# Patient Record
Sex: Female | Born: 1980 | Race: Black or African American | Hispanic: No | Marital: Married | Smoking: Never smoker
Health system: Southern US, Community
[De-identification: ages and names within clinical notes are randomized; demographics above are authoritative.]

## PROBLEM LIST (undated history)

## (undated) ENCOUNTER — Emergency Department (HOSPITAL_BASED_OUTPATIENT_CLINIC_OR_DEPARTMENT_OTHER): Source: Home / Self Care

## (undated) HISTORY — PX: MYOMECTOMY: SHX85

---

## 2015-04-23 DIAGNOSIS — N921 Excessive and frequent menstruation with irregular cycle: Secondary | ICD-10-CM | POA: Diagnosis present

## 2015-04-23 NOTE — H&P (Signed)
Amanda Baird is an 35 y.o. female. G0 with menorrhagia presenting for scheduled surgery. Pt has a history of fibroids causing heavy menses and pelvic pressure. She had a myomectomy in 2013 and had improvement of symptoms however in past year has has heavier menses and pelvic discomfort is similar to prior to myomectomy. Pelvic ultrasound showed several fibroids ranging from 7-29mm with nl endometrial lining. Reviewed all options for managememt including hormonal pills/implants/LARCS, lysteda, myomectomy, mrifus, uterine artery embolization, hysterectomy R/b discussed and all questions answered. Pt opts for total laparoscopic hysterectomy, possible lavh or open, bilateral salpingectomy, possible cystoscopy. Does not plan on carrying a pregnancy in future  Pertinent Gynecological History: Menses: flow is excessive with use of >10 pads or tampons on heaviest days Bleeding: dysfunctional uterine bleeding Contraception: none DES exposure: denies Blood transfusions: none Sexually transmitted diseases: no past history Previous GYN Procedures: myomectomy  Last mammogram: n/a Date:  Last pap: normal Date: 03/24/2015 OB History: G0, P0   Menstrual History: Menarche age: 33  No LMP recorded.    No past medical history on file.  No past surgical history on file.  No family history on file.  Social History:  has no tobacco, alcohol, and drug history on file.  Allergies: Allergies not on file  No prescriptions prior to admission    Review of Systems  Constitutional: Positive for malaise/fatigue. Negative for fever, chills and weight loss.  Eyes: Negative for blurred vision.  Respiratory: Negative for shortness of breath.   Cardiovascular: Negative for chest pain and orthopnea.  Gastrointestinal: Positive for abdominal pain. Negative for heartburn, nausea and vomiting.  Genitourinary: Negative for dysuria.  Musculoskeletal: Negative for back pain.  Neurological: Negative for dizziness,  focal weakness and headaches.  Psychiatric/Behavioral: Negative for depression. The patient is not nervous/anxious.     There were no vitals taken for this visit. Physical Exam  Constitutional: She is oriented to person, place, and time. She appears well-developed and well-nourished.  HENT:  Head: Normocephalic.  Neck: Normal range of motion.  Cardiovascular: Normal rate.   Respiratory: Effort normal.  GI: Soft. She exhibits no mass. There is no rebound and no guarding.  Genitourinary: Vagina normal and uterus normal.  Musculoskeletal: Normal range of motion.  Neurological: She is alert and oriented to person, place, and time.  Skin: Skin is warm.  Psychiatric: She has a normal mood and affect. Her behavior is normal. Judgment and thought content normal.    No results found for this or any previous visit (from the past 24 hour(s)).  No results found.  Assessment/Plan: 35yo G0 female with menorrhagia due to fibroids opting for definitive treatment with total laparoscopic hysterectomy, possible lavh or open, bilateral salpingectomy, possible cystoscopy R/B/A reviewed and all questions answered To OR when ready  Sherlyn Hay 04/23/2015, 11:53 PM

## 2015-05-17 ENCOUNTER — Encounter (HOSPITAL_COMMUNITY)
Admission: RE | Admit: 2015-05-17 | Discharge: 2015-05-17 | Disposition: A | Discharge status: Home / Self Care | Payer: BLUE CROSS/BLUE SHIELD | Source: Ambulatory Visit | Attending: Obstetrics and Gynecology | Admitting: Obstetrics and Gynecology

## 2015-05-17 ENCOUNTER — Encounter (HOSPITAL_COMMUNITY): Payer: Self-pay

## 2015-05-17 DIAGNOSIS — Z01812 Encounter for preprocedural laboratory examination: Secondary | ICD-10-CM | POA: Diagnosis not present

## 2015-05-17 DIAGNOSIS — N921 Excessive and frequent menstruation with irregular cycle: Secondary | ICD-10-CM | POA: Diagnosis not present

## 2015-05-17 LAB — CBC WITH DIFFERENTIAL/PLATELET
Basophils Absolute: 0 10*3/uL (ref 0.0–0.1)
Basophils Relative: 0 %
Eosinophils Absolute: 0.1 10*3/uL (ref 0.0–0.7)
Eosinophils Relative: 2 %
HEMATOCRIT: 41.5 % (ref 36.0–46.0)
HEMOGLOBIN: 14 g/dL (ref 12.0–15.0)
LYMPHS ABS: 1.4 10*3/uL (ref 0.7–4.0)
LYMPHS PCT: 28 %
MCH: 31.3 pg (ref 26.0–34.0)
MCHC: 33.7 g/dL (ref 30.0–36.0)
MCV: 92.8 fL (ref 78.0–100.0)
MONOS PCT: 8 %
Monocytes Absolute: 0.4 10*3/uL (ref 0.1–1.0)
NEUTROS ABS: 3 10*3/uL (ref 1.7–7.7)
NEUTROS PCT: 62 %
Platelets: 201 10*3/uL (ref 150–400)
RBC: 4.47 MIL/uL (ref 3.87–5.11)
RDW: 12.5 % (ref 11.5–15.5)
WBC: 4.9 10*3/uL (ref 4.0–10.5)

## 2015-05-17 LAB — BASIC METABOLIC PANEL
ANION GAP: 6 (ref 5–15)
BUN: 15 mg/dL (ref 6–20)
CHLORIDE: 103 mmol/L (ref 101–111)
CO2: 27 mmol/L (ref 22–32)
Calcium: 9.1 mg/dL (ref 8.9–10.3)
Creatinine, Ser: 0.81 mg/dL (ref 0.44–1.00)
GFR calc non Af Amer: >60 mL/min (ref >60)
Glucose, Bld: 94 mg/dL (ref 65–99)
Potassium: 4.4 mmol/L (ref 3.5–5.1)
Sodium: 136 mmol/L (ref 135–145)

## 2015-05-17 LAB — TYPE AND SCREEN
ABO/RH(D): O POS
Antibody Screen: NEGATIVE

## 2015-05-17 LAB — ABO/RH: ABO/RH(D): O POS

## 2015-05-17 NOTE — Patient Instructions (Addendum)
Your procedure is scheduled on:  Thursday, May 26, 2015  Enter through the Main Entrance of [facility] at: 6:00 AM  Pick up the phone at the desk and dial [phone].  Call this number if you have problems the morning of surgery: [phone].  Remember:  Do NOT eat food or drink after:  Midnight Wednesday May 25, 2015  Take these medicines the morning of surgery with a SIP OF WATER:  None  Do NOT wear jewelry (body piercing), metal hair clips/bobby pins, make-up, or nail polish. Do NOT wear lotions, powders, or perfumes.  You may wear deoderant. Do NOT shave for 48 hours prior to surgery. Do NOT bring valuables to the hospital. Contacts, dentures, or bridgework may not be worn into surgery.  Leave suitcase in car.  After surgery it may be brought to your room.  For patients admitted to the hospital, checkout time is 11:00 AM the day of discharge.

## 2015-05-26 ENCOUNTER — Ambulatory Visit (HOSPITAL_COMMUNITY): Payer: BLUE CROSS/BLUE SHIELD | Admitting: Anesthesiology

## 2015-05-26 ENCOUNTER — Encounter (HOSPITAL_COMMUNITY): Payer: Self-pay | Admitting: Anesthesiology

## 2015-05-26 ENCOUNTER — Inpatient Hospital Stay (HOSPITAL_COMMUNITY)
Admission: AD | Admit: 2015-05-26 | Discharge: 2015-05-27 | DRG: 743 | Disposition: A | Discharge status: Home / Self Care | Payer: BLUE CROSS/BLUE SHIELD | Source: Ambulatory Visit | Attending: Obstetrics and Gynecology | Admitting: Obstetrics and Gynecology

## 2015-05-26 ENCOUNTER — Encounter (HOSPITAL_COMMUNITY)
Admission: AD | Disposition: A | Discharge status: Home / Self Care | Payer: Self-pay | Source: Ambulatory Visit | Attending: Obstetrics and Gynecology

## 2015-05-26 DIAGNOSIS — D252 Subserosal leiomyoma of uterus: Secondary | ICD-10-CM | POA: Diagnosis present

## 2015-05-26 DIAGNOSIS — D251 Intramural leiomyoma of uterus: Secondary | ICD-10-CM | POA: Diagnosis present

## 2015-05-26 DIAGNOSIS — N921 Excessive and frequent menstruation with irregular cycle: Principal | ICD-10-CM | POA: Diagnosis present

## 2015-05-26 DIAGNOSIS — Z90710 Acquired absence of both cervix and uterus: Secondary | ICD-10-CM | POA: Diagnosis present

## 2015-05-26 HISTORY — PX: ABDOMINAL HYSTERECTOMY: SHX81

## 2015-05-26 HISTORY — PX: LAPAROSCOPY: SHX197

## 2015-05-26 LAB — PREGNANCY, URINE: Preg Test, Ur: NEGATIVE

## 2015-05-26 SURGERY — HYSTERECTOMY, ABDOMINAL
Anesthesia: General

## 2015-05-26 MED ORDER — LACTATED RINGERS IV SOLN
INTRAVENOUS | Status: DC
  Administered 2015-05-26: 13:00:00 via INTRAVENOUS

## 2015-05-26 MED ORDER — SUGAMMADEX SODIUM 200 MG/2ML IV SOLN
INTRAVENOUS | Status: DC | PRN
  Administered 2015-05-26: 123.6 mg via INTRAVENOUS

## 2015-05-26 MED ORDER — HYDROMORPHONE HCL 1 MG/ML IJ SOLN
INTRAMUSCULAR | Status: AC
  Filled 2015-05-26: qty 1

## 2015-05-26 MED ORDER — ONDANSETRON HCL 4 MG/2ML IJ SOLN: 4.0000 mg | Freq: Four times a day (QID) | INTRAMUSCULAR | Status: DC | PRN

## 2015-05-26 MED ORDER — BUPIVACAINE HCL (PF) 0.25 % IJ SOLN
INTRAMUSCULAR | Status: AC
  Filled 2015-05-26: qty 30

## 2015-05-26 MED ORDER — LACTATED RINGERS IR SOLN
Status: DC | PRN
  Administered 2015-05-26: 3000 mL

## 2015-05-26 MED ORDER — ONDANSETRON HCL 4 MG/2ML IJ SOLN
INTRAMUSCULAR | Status: AC
  Filled 2015-05-26: qty 2

## 2015-05-26 MED ORDER — MIDAZOLAM HCL 2 MG/2ML IJ SOLN
INTRAMUSCULAR | Status: DC | PRN
  Administered 2015-05-26 (×2): 1 mg via INTRAVENOUS

## 2015-05-26 MED ORDER — PROPOFOL 10 MG/ML IV BOLUS
INTRAVENOUS | Status: AC
  Filled 2015-05-26: qty 20

## 2015-05-26 MED ORDER — LACTATED RINGERS IV SOLN
INTRAVENOUS | Status: DC
Start: 2015-05-26 — End: 2015-05-26
  Administered 2015-05-26: 06:00:00 via INTRAVENOUS

## 2015-05-26 MED ORDER — ROCURONIUM BROMIDE 100 MG/10ML IV SOLN
INTRAVENOUS | Status: DC | PRN
  Administered 2015-05-26: 10 mg via INTRAVENOUS
  Administered 2015-05-26: 40 mg via INTRAVENOUS

## 2015-05-26 MED ORDER — LACTATED RINGERS IV SOLN
INTRAVENOUS | Status: DC
  Administered 2015-05-26 (×2): via INTRAVENOUS

## 2015-05-26 MED ORDER — ONDANSETRON HCL 4 MG/2ML IJ SOLN
INTRAMUSCULAR | Status: DC | PRN
  Administered 2015-05-26: 4 mg via INTRAVENOUS

## 2015-05-26 MED ORDER — IBUPROFEN 600 MG PO TABS
600.0000 mg | ORAL_TABLET | Freq: Four times a day (QID) | ORAL | Status: DC | PRN
  Administered 2015-05-27 (×3): 600 mg via ORAL
  Filled 2015-05-26 (×3): qty 1

## 2015-05-26 MED ORDER — FENTANYL CITRATE (PF) 250 MCG/5ML IJ SOLN
INTRAMUSCULAR | Status: AC
  Filled 2015-05-26: qty 5

## 2015-05-26 MED ORDER — MIDAZOLAM HCL 2 MG/2ML IJ SOLN
INTRAMUSCULAR | Status: AC
  Filled 2015-05-26: qty 2

## 2015-05-26 MED ORDER — BUPIVACAINE HCL (PF) 0.25 % IJ SOLN
INTRAMUSCULAR | Status: DC | PRN
  Administered 2015-05-26: 3 mL

## 2015-05-26 MED ORDER — CEFAZOLIN SODIUM-DEXTROSE 2-3 GM-% IV SOLR
INTRAVENOUS | Status: AC
  Filled 2015-05-26: qty 50

## 2015-05-26 MED ORDER — HYDROMORPHONE HCL 1 MG/ML IJ SOLN
0.2000 mg | INTRAMUSCULAR | Status: DC | PRN
  Administered 2015-05-26: 0.2 mg via INTRAVENOUS
  Filled 2015-05-26: qty 1

## 2015-05-26 MED ORDER — PROMETHAZINE HCL 25 MG/ML IJ SOLN: 6.2500 mg | INTRAMUSCULAR | Status: DC | PRN

## 2015-05-26 MED ORDER — SUGAMMADEX SODIUM 200 MG/2ML IV SOLN
INTRAVENOUS | Status: AC
  Filled 2015-05-26: qty 2

## 2015-05-26 MED ORDER — ESMOLOL HCL 100 MG/10ML IV SOLN
INTRAVENOUS | Status: AC
Start: 2015-05-26 — End: 2015-05-26
  Filled 2015-05-26: qty 10

## 2015-05-26 MED ORDER — ONDANSETRON HCL 4 MG PO TABS: 4.0000 mg | ORAL_TABLET | Freq: Four times a day (QID) | ORAL | Status: DC | PRN

## 2015-05-26 MED ORDER — KETOROLAC TROMETHAMINE 30 MG/ML IJ SOLN
INTRAMUSCULAR | Status: AC
  Filled 2015-05-26: qty 1

## 2015-05-26 MED ORDER — SIMETHICONE 80 MG PO CHEW
80.0000 mg | CHEWABLE_TABLET | Freq: Four times a day (QID) | ORAL | Status: DC | PRN
  Filled 2015-05-26: qty 1

## 2015-05-26 MED ORDER — SCOPOLAMINE 1 MG/3DAYS TD PT72
1.0000 | MEDICATED_PATCH | Freq: Once | TRANSDERMAL | Status: DC
  Administered 2015-05-26: 1.5 mg via TRANSDERMAL

## 2015-05-26 MED ORDER — MAGNESIUM HYDROXIDE 400 MG/5ML PO SUSP
30.0000 mL | Freq: Every day | ORAL | Status: DC | PRN
  Filled 2015-05-26: qty 30

## 2015-05-26 MED ORDER — LIDOCAINE HCL (CARDIAC) 20 MG/ML IV SOLN
INTRAVENOUS | Status: AC
  Filled 2015-05-26: qty 5

## 2015-05-26 MED ORDER — ESMOLOL HCL 100 MG/10ML IV SOLN
INTRAVENOUS | Status: DC | PRN
  Administered 2015-05-26: 5 mg via INTRAVENOUS

## 2015-05-26 MED ORDER — CEFAZOLIN SODIUM-DEXTROSE 2-4 GM/100ML-% IV SOLN
2.0000 g | INTRAVENOUS | Status: AC
  Administered 2015-05-26: 2 g via INTRAVENOUS
  Filled 2015-05-26: qty 100

## 2015-05-26 MED ORDER — LIDOCAINE HCL 1 % IJ SOLN
INTRAMUSCULAR | Status: AC
  Filled 2015-05-26: qty 20

## 2015-05-26 MED ORDER — PHENYLEPHRINE 40 MCG/ML (10ML) SYRINGE FOR IV PUSH (FOR BLOOD PRESSURE SUPPORT)
PREFILLED_SYRINGE | INTRAVENOUS | Status: AC
  Filled 2015-05-26: qty 10

## 2015-05-26 MED ORDER — PHENYLEPHRINE HCL 10 MG/ML IJ SOLN
INTRAMUSCULAR | Status: DC | PRN
  Administered 2015-05-26: 40 ug via INTRAVENOUS
  Administered 2015-05-26 (×2): 80 ug via INTRAVENOUS

## 2015-05-26 MED ORDER — HYDROMORPHONE HCL 1 MG/ML IJ SOLN
0.2500 mg | INTRAMUSCULAR | Status: DC | PRN
  Administered 2015-05-26: 0.5 mg via INTRAVENOUS
  Administered 2015-05-26: 0.25 mg via INTRAVENOUS
  Administered 2015-05-26: 0.5 mg via INTRAVENOUS
  Administered 2015-05-26: 0.25 mg via INTRAVENOUS

## 2015-05-26 MED ORDER — ROCURONIUM BROMIDE 100 MG/10ML IV SOLN
INTRAVENOUS | Status: AC
  Filled 2015-05-26: qty 1

## 2015-05-26 MED ORDER — GLYCOPYRROLATE 0.2 MG/ML IJ SOLN
INTRAMUSCULAR | Status: AC
  Filled 2015-05-26: qty 3

## 2015-05-26 MED ORDER — DEXAMETHASONE SODIUM PHOSPHATE 4 MG/ML IJ SOLN
INTRAMUSCULAR | Status: AC
  Filled 2015-05-26: qty 1

## 2015-05-26 MED ORDER — NEOSTIGMINE METHYLSULFATE 10 MG/10ML IV SOLN
INTRAVENOUS | Status: AC
  Filled 2015-05-26: qty 1

## 2015-05-26 MED ORDER — MEPERIDINE HCL 25 MG/ML IJ SOLN: 6.2500 mg | INTRAMUSCULAR | Status: DC | PRN

## 2015-05-26 MED ORDER — KETOROLAC TROMETHAMINE 30 MG/ML IJ SOLN: 30.0000 mg | Freq: Once | INTRAMUSCULAR | Status: DC

## 2015-05-26 MED ORDER — DEXAMETHASONE SODIUM PHOSPHATE 10 MG/ML IJ SOLN
INTRAMUSCULAR | Status: DC | PRN
  Administered 2015-05-26: 4 mg via INTRAVENOUS

## 2015-05-26 MED ORDER — MENTHOL 3 MG MT LOZG
1.0000 | LOZENGE | OROMUCOSAL | Status: DC | PRN
Start: 2015-05-26 — End: 2015-05-27
  Filled 2015-05-26: qty 9

## 2015-05-26 MED ORDER — SCOPOLAMINE 1 MG/3DAYS TD PT72
MEDICATED_PATCH | TRANSDERMAL | Status: AC
  Filled 2015-05-26: qty 1

## 2015-05-26 MED ORDER — KETOROLAC TROMETHAMINE 30 MG/ML IJ SOLN
INTRAMUSCULAR | Status: DC | PRN
  Administered 2015-05-26: 30 mg via INTRAVENOUS

## 2015-05-26 MED ORDER — SODIUM CHLORIDE 0.9 % IJ SOLN
INTRAMUSCULAR | Status: AC
  Filled 2015-05-26: qty 10

## 2015-05-26 MED ORDER — PROPOFOL 10 MG/ML IV BOLUS
INTRAVENOUS | Status: DC | PRN
  Administered 2015-05-26: 170 mg via INTRAVENOUS

## 2015-05-26 MED ORDER — FENTANYL CITRATE (PF) 100 MCG/2ML IJ SOLN
INTRAMUSCULAR | Status: DC | PRN
  Administered 2015-05-26: 50 ug via INTRAVENOUS
  Administered 2015-05-26: 100 ug via INTRAVENOUS
  Administered 2015-05-26 (×2): 50 ug via INTRAVENOUS

## 2015-05-26 MED ORDER — OXYCODONE-ACETAMINOPHEN 5-325 MG PO TABS: 2.0000 | ORAL_TABLET | Freq: Four times a day (QID) | ORAL | Status: DC | PRN

## 2015-05-26 MED ORDER — LIDOCAINE HCL (CARDIAC) 20 MG/ML IV SOLN
INTRAVENOUS | Status: DC | PRN
  Administered 2015-05-26: 30 mg via INTRAVENOUS
  Administered 2015-05-26: 70 mg via INTRAVENOUS

## 2015-05-26 SURGICAL SUPPLY — 67 items
BARRIER ADHS 3X4 INTERCEED (GAUZE/BANDAGES/DRESSINGS) IMPLANT
BENZOIN TINCTURE PRP APPL 2/3 (GAUZE/BANDAGES/DRESSINGS) ×4 IMPLANT
CABLE HIGH FREQUENCY MONO STRZ (ELECTRODE) IMPLANT
CANISTER SUCT 3000ML (MISCELLANEOUS) ×4 IMPLANT
CHLORAPREP W/TINT 26ML (MISCELLANEOUS) IMPLANT
CLOSURE WOUND 1/2 X4 (GAUZE/BANDAGES/DRESSINGS) ×1
CLOTH BEACON ORANGE TIMEOUT ST (SAFETY) ×4 IMPLANT
COVER BACK TABLE 60X90IN (DRAPES) ×4 IMPLANT
COVER LIGHT HANDLE  1/PK (MISCELLANEOUS) ×4
COVER LIGHT HANDLE 1/PK (MISCELLANEOUS) ×4 IMPLANT
COVER MAYO STAND STRL (DRAPES) ×4 IMPLANT
DECANTER SPIKE VIAL GLASS SM (MISCELLANEOUS) ×4 IMPLANT
DRAPE WARM FLUID 44X44 (DRAPE) IMPLANT
DRSG OPSITE POSTOP 4X10 (GAUZE/BANDAGES/DRESSINGS) ×4 IMPLANT
DRSG TELFA 3X8 NADH (GAUZE/BANDAGES/DRESSINGS) ×4 IMPLANT
DURAPREP 26ML APPLICATOR (WOUND CARE) ×4 IMPLANT
EVACUATOR SMOKE 8.L (FILTER) ×4 IMPLANT
GAUZE SPONGE 4X4 16PLY XRAY LF (GAUZE/BANDAGES/DRESSINGS) IMPLANT
GLOVE BIO SURGEON STRL SZ 6.5 (GLOVE) ×6 IMPLANT
GLOVE BIO SURGEONS STRL SZ 6.5 (GLOVE) ×2
GLOVE BIOGEL PI IND STRL 7.0 (GLOVE) ×8 IMPLANT
GLOVE BIOGEL PI INDICATOR 7.0 (GLOVE) ×8
GLOVE ORTHO TXT STRL SZ7.5 (GLOVE) ×4 IMPLANT
GOWN STRL REUS W/TWL LRG LVL3 (GOWN DISPOSABLE) ×12 IMPLANT
LIQUID BAND (GAUZE/BANDAGES/DRESSINGS) ×4 IMPLANT
NEEDLE HYPO 22GX1.5 SAFETY (NEEDLE) IMPLANT
NS IRRIG 1000ML POUR BTL (IV SOLUTION) ×4 IMPLANT
OCCLUDER COLPOPNEUMO (BALLOONS) ×4 IMPLANT
PACK ABDOMINAL GYN (CUSTOM PROCEDURE TRAY) ×4 IMPLANT
PACK LAVH (CUSTOM PROCEDURE TRAY) ×4 IMPLANT
PACK ROBOTIC GOWN (GOWN DISPOSABLE) ×8 IMPLANT
PAD OB MATERNITY 4.3X12.25 (PERSONAL CARE ITEMS) ×4 IMPLANT
PAD TRENDELENBURG POSITION (MISCELLANEOUS) ×4 IMPLANT
PENCIL SMOKE EVAC W/HOLSTER (ELECTROSURGICAL) ×4 IMPLANT
RTRCTR C-SECT PINK 25CM LRG (MISCELLANEOUS) IMPLANT
SCALPEL HARMONIC ACE (MISCELLANEOUS) ×4 IMPLANT
SCISSORS LAP 5X35 DISP (ENDOMECHANICALS) IMPLANT
SET CYSTO W/LG BORE CLAMP LF (SET/KITS/TRAYS/PACK) IMPLANT
SET IRRIG TUBING LAPAROSCOPIC (IRRIGATION / IRRIGATOR) ×4 IMPLANT
SLEEVE XCEL OPT CAN 5 100 (ENDOMECHANICALS) ×4 IMPLANT
SPATULA 33CM PLASMA (CUTTING FORCEPS) ×4 IMPLANT
SPONGE LAP 18X18 X RAY DECT (DISPOSABLE) ×8 IMPLANT
STAPLER VISISTAT 35W (STAPLE) ×4 IMPLANT
STRIP CLOSURE SKIN 1/2X4 (GAUZE/BANDAGES/DRESSINGS) ×3 IMPLANT
SUT DVC VLOC 180 0 12IN GS21 (SUTURE) ×4
SUT PDS AB 0 CTX 60 (SUTURE) IMPLANT
SUT VIC AB 0 CT1 27 (SUTURE) ×4
SUT VIC AB 0 CT1 27XBRD ANBCTR (SUTURE) ×4 IMPLANT
SUT VIC AB 0 CT1 36 (SUTURE) IMPLANT
SUT VIC AB 4-0 KS 27 (SUTURE) ×4 IMPLANT
SUT VIC AB 4-0 PS2 27 (SUTURE) ×4 IMPLANT
SUT VICRYL 0 TIES 12 18 (SUTURE) IMPLANT
SUT VICRYL 0 UR6 27IN ABS (SUTURE) ×4 IMPLANT
SUTURE DVC VLC 180 0 12IN GS21 (SUTURE) ×2 IMPLANT
SYR CONTROL 10ML LL (SYRINGE) IMPLANT
SYR TOOMEY 50ML (SYRINGE) ×4 IMPLANT
SYRINGE 10CC LL (SYRINGE) ×4 IMPLANT
TIP UTERINE 5.1X6CM LAV DISP (MISCELLANEOUS) IMPLANT
TIP UTERINE 6.7X10CM GRN DISP (MISCELLANEOUS) IMPLANT
TIP UTERINE 6.7X6CM WHT DISP (MISCELLANEOUS) IMPLANT
TIP UTERINE 6.7X8CM BLUE DISP (MISCELLANEOUS) ×4 IMPLANT
TOWEL OR 17X24 6PK STRL BLUE (TOWEL DISPOSABLE) ×8 IMPLANT
TRAY FOLEY CATH SILVER 14FR (SET/KITS/TRAYS/PACK) ×4 IMPLANT
TROCAR OPTI TIP 5M 100M (ENDOMECHANICALS) ×4 IMPLANT
TROCAR XCEL DIL TIP R 11M (ENDOMECHANICALS) ×4 IMPLANT
WARMER LAPAROSCOPE (MISCELLANEOUS) ×4 IMPLANT
WATER STERILE IRR 1000ML POUR (IV SOLUTION) ×4 IMPLANT

## 2015-05-26 NOTE — Anesthesia Postprocedure Evaluation (Signed)
Anesthesia Post Note  Patient: Amanda Baird  Procedure(s) Performed: Procedure(s) (LRB):  ABDOMINAL HYSTERECTOMY with bilateral salpingectomy. (N/A) LAPAROSCOPY DIAGNOSTIC (N/A)  Patient location during evaluation: PACU Anesthesia Type: General Level of consciousness: awake Pain management: pain level controlled Vital Signs Assessment: post-procedure vital signs reviewed and stable Respiratory status: spontaneous breathing Cardiovascular status: stable Postop Assessment: no signs of nausea or vomiting Anesthetic complications: no    Last Vitals:  Filed Vitals:   05/26/15 0945 05/26/15 1000  BP: 115/68 111/66  Pulse: 85 80  Temp:    Resp: 16 16    Last Pain:  Filed Vitals:   05/26/15 1006  PainSc: [place]

## 2015-05-26 NOTE — Addendum Note (Signed)
Addendum  created 05/26/15 1356 by Riki Sheer, CRNA   Modules edited: Clinical Notes   Clinical Notes:  File: [id number]

## 2015-05-26 NOTE — Brief Op Note (Signed)
05/26/2015  9:26 AM  PATIENT:  Amanda Baird  35 y.o. female  PRE-OPERATIVE DIAGNOSIS:  Menometrorrhagia, fibroids  POST-OPERATIVE DIAGNOSIS:  Menometrorrhagia, fibroids  PROCEDURE:  Procedure(s):  ABDOMINAL HYSTERECTOMY with bilateral salpingectomy. (N/A) LAPAROSCOPY DIAGNOSTIC (N/A)  SURGEON:  Surgeon(s) and Role:    * Maudean Hoffmann Dionisio David, DO - Primary  PHYSICIAN ASSISTANT:  Dr Sherren Mocha Meisinger  ASSISTANTS:   ANESTHESIA:   general  EBL:  Total I/O In: 1300 [I.V.:1300] Out: 200 [Urine:50; Blood:150]  BLOOD ADMINISTERED:none  DRAINS: none   LOCAL MEDICATIONS USED:  MARCAINE     SPECIMEN:  Source of Specimen:  uterus, cervix and bilateral fallopian tubes  DISPOSITION OF SPECIMEN:  PATHOLOGY  COUNTS:  YES  TOURNIQUET:  * No tourniquets in log *  DICTATION: .Note written in EPIC  PLAN OF CARE: Admit to inpatient   PATIENT DISPOSITION:  PACU - hemodynamically stable.   Delay start of Pharmacological VTE agent (>24hrs) due to surgical blood loss or risk of bleeding: not applicable

## 2015-05-26 NOTE — Progress Notes (Signed)
Day of Surgery Procedure(s) (LRB):  ABDOMINAL HYSTERECTOMY with bilateral salpingectomy. (N/A) LAPAROSCOPY DIAGNOSTIC (N/A)  Subjective: Patient reports nausea, incisional pain and tolerating PO.   Pain controlled with meds however and no vomiting. Feels well. Ambulated to window  Objective: I have reviewed patient's vital signs, intake and output and medications.  General: alert, cooperative and no distress  Assessment: s/p Procedure(s):  ABDOMINAL HYSTERECTOMY with bilateral salpingectomy. (N/A) LAPAROSCOPY DIAGNOSTIC (N/A): stable  Plan: Advance diet Discontinue IV fluids  Routine post op care  LOS: 0 days    [facility] 05/26/2015, 6:20 PM

## 2015-05-26 NOTE — Anesthesia Procedure Notes (Signed)
Procedure Name: Intubation Date/Time: 05/26/2015 7:33 AM Performed by: Tobin Chad Pre-anesthesia Checklist: Patient identified, Emergency Drugs available, Suction available, Patient being monitored and Timeout performed Patient Re-evaluated:Patient Re-evaluated prior to inductionOxygen Delivery Method: Circle system utilized and Simple face mask Preoxygenation: Pre-oxygenation with 100% oxygen Intubation Type: IV induction and Inhalational induction Ventilation: Mask ventilation without difficulty Laryngoscope Size: Mac and 3 Grade View: Grade II Tube type: Oral Tube size: 7.0 mm Number of attempts: 1 Placement Confirmation: ETT inserted through vocal cords under direct vision,  positive ETCO2 and breath sounds checked- equal and bilateral Secured at: 22 cm Tube secured with: Tape Dental Injury: Teeth and Oropharynx as per pre-operative assessment

## 2015-05-26 NOTE — Op Note (Addendum)
PATIENT: Amanda Baird 35 y.o. female  PRE-OPERATIVE DIAGNOSIS: Menometrorrhagia, fibroids  POST-OPERATIVE DIAGNOSIS: Menometrorrhagia, fibroids  PROCEDURE: Procedure(s):  ABDOMINAL HYSTERECTOMY with bilateral salpingectomy. (N/A)  LAPAROSCOPY DIAGNOSTIC (N/A)  SURGEON: Surgeon(s) and Role:  * Haddon Fyfe Dionisio David, DO - Primary  PHYSICIAN ASSISTANT: Dr Sherren Mocha Meisinger  ASSISTANTS:  ANESTHESIA: general  EBL: Total I/O  In: 1300 [I.V.:1300]  Out: 200 [Urine:50; Blood:150]  BLOOD ADMINISTERED:none  DRAINS: none  LOCAL MEDICATIONS USED: MARCAINE  SPECIMEN: Source of Specimen: uterus, cervix and bilateral fallopian tubes  DISPOSITION OF SPECIMEN: PATHOLOGY  COUNTS: YES  TOURNIQUET: * No tourniquets in log *  DICTATION: .Note written in EPIC  PLAN OF CARE: Admit to inpatient  PATIENT DISPOSITION: PACU - hemodynamically stable.  Delay start of Pharmacological VTE agent (>24hrs) due to surgical    Patient was taken to the operating room. She was placed in the dorsal supine position. She had a running IV in place. Informed consent was present on the chart. SCDs on her lower extremities and functioning properly. General endotracheal anesthesia was administered by the anesthesia staff without difficulty.  Once adequate anesthesia was confirmed the legs were placed in the low lithotomy position in [place]. Her arms were tucked by the side.  Chlor prep was then used to prep the abdomen and Betadine was used to prep the inner thighs, perineum and vagina. Once 3 minutes had past the patient was draped in a normal standard fashion. The legs were lifted to the high lithotomy position. The cervix was visualized by placing a heavy weighted speculum in the posterior aspect of the vagina and using a curved Deaver retractor to the retract anteriorly. The anterior lip of the cervix was grasped with single-tooth tenaculum. The anterior lip of the cervix was grasped with a single toothed tenaculum and  the uterus sounded to 8.5cm. A rumi was assembled with a small koh cup. Two retaining sutures were placed at 3 and 9 o clock. An attempt was made to place the rumi however due to small vaginal opening and vault this was unsuccessful after several tries. Decision was made to attempt to proceed via LAVH. Hence a hulka uterine manipulatior was placed, foley to drainage and legs lowered.   0.25% Marcaine was used anesthetize the skin below the umbilicus. A 58mm incision was made and an optiview trocar placed into the abdominal cavity.  2.5 liters of gas was insufflated to obtain pneumoperitoneum. The laparoscope was then used to confirm intraperitoneal placement. Patient was placed in trendelenberg position and the uterus was anteverted. At this time there were multiple fibroids noted with the largest in the posterior region of the uterus. This gave the uterus a multilobular appearance. Uterus appeared to be larger than u/s estimates had stated. Due to small vaginal vault decision was made to abort attempt of laparoscopic hysterectomy and proceed with an abdominal hysterectomy instead. Both fallopian tubes and ovaries appeared grossly normal.  The patient was flattened and the trocar removed.  At this time a pfannestiel incison was made 2 cm above the pubic symphysis and carried through to the underlying fascia with the bovie. Small bleeding sites were cauterized to maintain hemostasis. The fascia was incised in the midline and the incision extended on both sides with the mayo scissors. Next the rectus muscles were separated and the peritoneum entered bluntly.  An alexis was placed to help visualization. The patient was placed in trendelenberg and bowels packed back with wet lap sponges. The uterine fundus was grasped with a double  toothed clamped and elevated. The left fallopian tube was then grasped with a babcock and excised with the bovie to the uterine cornua. Next, with uterus on stretch, the left IP ligament  and then the round ligament  were clamped, cut and suture ligated hemostatically. The broad ligament was also similarly clamped cut and ligated in two bites. The anterior  peritoneum was then opened and a baldder flap created. The same was done on the right side. The uterosacrals were clamped cut and ligated next. Then the uterine vessels were then clamped cut and ligated with excellent hemostasis noted. The uterus and cervix were then amputated from the anterior vagina in a series of clamps and ligated to also maintain hemostasis. The specimen was passed off the field and to be sent to pathology for evaluation.  The vaginal cuff was then closed next with three interrupted stiches in a figure 8. The cardinal then uterosacral ligaments were tied across to each other to maintain support. Irrigation of the pelvis was done - no bleeding noted.   At this point, patient was taken out of Trendelenburg. The peritoneum was closed in a pursestring fashion. The fascia was closed next with o vicryl in a running manner using two sutures. The subcutaneous layer was also irrigated with small bleeding sites cauterized. The was not much to reapproximate next the skin was closed using 4-0 vicryl on a keith needle. Honeycomb dressing was applied. The umbilical incision was closed with surgical glue.  Counts were noted to be correct.  Patient was taken to recovery room in stable condition.

## 2015-05-26 NOTE — Anesthesia Postprocedure Evaluation (Signed)
Anesthesia Post Note  Patient: Amanda Baird  Procedure(s) Performed: Procedure(s) (LRB):  ABDOMINAL HYSTERECTOMY with bilateral salpingectomy. (N/A) LAPAROSCOPY DIAGNOSTIC (N/A)  Patient location during evaluation: [facility] Anesthesia Type: General Level of consciousness: awake and alert Pain management: pain level controlled Vital Signs Assessment: post-procedure vital signs reviewed and stable Respiratory status: spontaneous breathing, nonlabored ventilation and respiratory function stable Cardiovascular status: blood pressure returned to baseline and stable Postop Assessment: no signs of nausea or vomiting Anesthetic complications: no    Last Vitals:  Filed Vitals:   05/26/15 1232 05/26/15 1330  BP: 112/57 106/60  Pulse: 85 78  Temp: 36.8 C 36.4 C  Resp: 16 14    Last Pain:  Filed Vitals:   05/26/15 1340  PainSc: 2                  Kailena Lubas

## 2015-05-26 NOTE — Transfer of Care (Signed)
Immediate Anesthesia Transfer of Care Note  Patient: Amanda Baird  Procedure(s) Performed: Procedure(s):  ABDOMINAL HYSTERECTOMY with bilateral salpingectomy. (N/A) LAPAROSCOPY DIAGNOSTIC (N/A)  Patient Location: PACU  Anesthesia Type:General  Level of Consciousness: awake, sedated and unresponsive  Airway & Oxygen Therapy: Patient Spontanous Breathing and Patient connected to nasal cannula oxygen  Post-op Assessment: Report given to RN and Post -op Vital signs reviewed and stable  Post vital signs: Reviewed and stable  Last Vitals:  Filed Vitals:   05/26/15 0601  BP: 112/68  Pulse: 74  Temp: 36.4 C  Resp: 20    Complications: No apparent anesthesia complications

## 2015-05-26 NOTE — Interval H&P Note (Signed)
History and Physical Interval Note:  05/26/2015 7:18 AM  Amanda Baird  has presented today for surgery, with the diagnosis of Menometrorrhagia, fibroids  The various methods of treatment have been discussed with the patient and family. After consideration of risks, benefits and other options for treatment, the patient has consented to  Procedure(s): HYSTERECTOMY TOTAL LAPAROSCOPIC (N/A) LAPAROSCOPIC BILATERAL SALPINGECTOMY (Bilateral) POSSIBLE ABDOMINAL HYSTERECTOMY (N/A) as a surgical intervention .  The patient's history has been reviewed, patient examined, no change in status, stable for surgery.  I have reviewed the patient's chart and labs.  Questions were answered to the patient's satisfaction.     [place]

## 2015-05-26 NOTE — Anesthesia Preprocedure Evaluation (Signed)
Anesthesia Evaluation  Patient identified by MRN, date of birth, ID band Patient awake    Reviewed: Allergy & Precautions, H&P , NPO status , Patient's Chart, lab work & pertinent test results  Airway Mallampati: I  TM Distance: >3 FB Neck ROM: full    Dental no notable dental hx. (+) Teeth Intact   Pulmonary neg pulmonary ROS,    Pulmonary exam normal breath sounds clear to auscultation       Cardiovascular negative cardio ROS Normal cardiovascular exam     Neuro/Psych negative neurological ROS  negative psych ROS   GI/Hepatic negative GI ROS, Neg liver ROS,   Endo/Other  negative endocrine ROS  Renal/GU negative Renal ROS     Musculoskeletal   Abdominal Normal abdominal exam  (+)   Peds  Hematology negative hematology ROS (+)   Anesthesia Other Findings   Reproductive/Obstetrics negative OB ROS                             Anesthesia Physical Anesthesia Plan  ASA: I  Anesthesia Plan: General   Post-op Pain Management:    Induction: Intravenous  Airway Management Planned: Oral ETT  Additional Equipment:   Intra-op Plan:   Post-operative Plan:   Informed Consent: I have reviewed the patients History and Physical, chart, labs and discussed the procedure including the risks, benefits and alternatives for the proposed anesthesia with the patient or authorized representative who has indicated his/her understanding and acceptance.   Dental Advisory Given  Plan Discussed with: CRNA and Surgeon  Anesthesia Plan Comments:         Anesthesia Quick Evaluation

## 2015-05-27 LAB — CBC
HEMATOCRIT: 33.2 % — AB (ref 36.0–46.0)
Hemoglobin: 11.3 g/dL — ABNORMAL LOW (ref 12.0–15.0)
MCH: 31.6 pg (ref 26.0–34.0)
MCHC: 34 g/dL (ref 30.0–36.0)
MCV: 92.7 fL (ref 78.0–100.0)
PLATELETS: 166 10*3/uL (ref 150–400)
RBC: 3.58 MIL/uL — ABNORMAL LOW (ref 3.87–5.11)
RDW: 12.6 % (ref 11.5–15.5)
WBC: 9.1 10*3/uL (ref 4.0–10.5)

## 2015-05-27 MED ORDER — IBUPROFEN 800 MG PO TABS
800.0000 mg | ORAL_TABLET | Freq: Three times a day (TID) | ORAL | Status: DC | PRN
Start: 2015-05-27 — End: 2022-08-22

## 2015-05-27 MED ORDER — OXYCODONE-ACETAMINOPHEN 5-325 MG PO TABS: 1.0000 | ORAL_TABLET | Freq: Four times a day (QID) | ORAL | Status: DC | PRN

## 2015-05-27 MED ORDER — OXYCODONE-ACETAMINOPHEN 5-325 MG PO TABS
1.0000 | ORAL_TABLET | ORAL | Status: DC | PRN
  Administered 2015-05-27: 1 via ORAL
  Filled 2015-05-27: qty 1

## 2015-05-27 NOTE — Discharge Summary (Signed)
Physician Discharge Summary  Patient ID: Amanda Baird MRN: [id number] DOB/AGE: Dec 07, 1980 35 y.o.  Admit date: 05/26/2015 Discharge date: 05/27/2015  Admission Diagnoses: menometrorrhagia, uterine fibroids  Discharge Diagnoses:  Active Problems:   Menometrorrhagia   S/P TAH (total abdominal hysterectomy)   Discharged Condition: good  Hospital Course: admitted for attempted [facility] 3/23, converted to TAH.  Pt tolerated procedure well.  Pain controllewd with po meds.  Requests discharge to home at POD#1, ambulating, voiding and pain well controlled.  CBC stable  Consults: None  Significant Diagnostic Studies: labs: CBC  Treatments: surgery: TAH  Discharge Exam: Blood pressure 111/65, pulse 72, temperature 98 F (36.7 C), temperature source Oral, resp. rate 16, height 5\' 4"  (1.626 m), weight 61.689 kg (136 lb), SpO2 98 %. General appearance: alert and no distress GI: soft, non-tender; bowel sounds normal; no masses,  no organomegaly Incision/Wound:C/D/I  Disposition: Final discharge disposition not confirmed  Discharge Instructions    Call MD for:  persistant nausea and vomiting    Complete by:  As directed      Call MD for:  redness, tenderness, or signs of infection (pain, swelling, redness, odor or green/yellow discharge around incision site)    Complete by:  As directed      Call MD for:  severe uncontrolled pain    Complete by:  As directed      Diet - low sodium heart healthy    Complete by:  As directed      Discharge instructions    Complete by:  As directed   Call [phone] with questions or problems     Driving Restrictions    Complete by:  As directed   While taking strong pain medicines (about 2 weeks)     Increase activity slowly    Complete by:  As directed      Lifting restrictions    Complete by:  As directed   No greater than 10-15lbs for 6 weeks     May shower / Bathe    Complete by:  As directed      May walk up steps    Complete by:  As  directed      Sexual Activity Restrictions    Complete by:  As directed   Pelvic rest - no douching, tampons or sex for 6 weeks            Medication List    TAKE these medications        ibuprofen 800 MG tablet  Commonly known as:  ADVIL,MOTRIN  Take 1 tablet (800 mg total) by mouth every 8 (eight) hours as needed for mild pain or moderate pain.     oxyCODONE-acetaminophen 5-325 MG tablet  Commonly known as:  PERCOCET/ROXICET  Take 1-2 tablets by mouth every 6 (six) hours as needed for moderate pain or severe pain.           Follow-up Information    Follow up with [place], DO. Schedule an appointment as soon as possible for a visit in 2 weeks.   Specialty:  Obstetrics and Gynecology   Why:  for incision check then 6 weeks for full post-operative exam!   Contact information:   [place] [address] [phone]       Signed: Janyth Contes 05/27/2015, 6:48 PM

## 2015-05-27 NOTE — Progress Notes (Signed)
Patient ID: Amanda Baird, female   DOB: January 02, 1981, 35 y.o.   MRN: [id number]  Pt ambulating, voiding, tolerating po, and pain controlled; requests d/c to home.  S/P TAH POD#1 Doing well D/C with routine precautions D/c home with Motrin, perocet F/u 2 weeks for incision check and path review  Pt voices understanding and desires d/c

## 2015-05-27 NOTE — Progress Notes (Signed)
1 Day Post-Op Procedure(s) (LRB):  ABDOMINAL HYSTERECTOMY with bilateral salpingectomy. (N/A) LAPAROSCOPY DIAGNOSTIC (N/A)  Subjective: Patient reports incisional pain, tolerating PO and no problems voiding.  Pain better today. No complaints  Objective: I have reviewed patient's vital signs, intake and output, medications and labs.  General: alert, cooperative and no distress GI: soft, non-tender; bowel sounds normal; no masses,  no organomegaly and incision: clean, dry and intact Extremities: extremities normal, atraumatic, no cyanosis or edema  Assessment: s/p Procedure(s):  ABDOMINAL HYSTERECTOMY with bilateral salpingectomy. (N/A) LAPAROSCOPY DIAGNOSTIC (N/A): stable, progressing well and tolerating diet  Plan: Encourage ambulation Discontinue IV fluids Routne post op care. D/C foley  LOS: 1 day    [place] 05/27/2015, 8:22 AM

## 2015-05-27 NOTE — Progress Notes (Signed)
Pt discharged instructions given. Pain was assessed, dressing and incision site was assessed. Pt wheeled down  In a wheelchair  To her car by staff. Vital signs stable within normal limit. Prescription given.

## 2015-05-28 ENCOUNTER — Encounter (HOSPITAL_COMMUNITY): Payer: Self-pay | Admitting: Obstetrics and Gynecology

## 2015-06-15 NOTE — Addendum Note (Signed)
Encounter addended by: Sherlyn Hay, DO on: 06/15/2015 11:46 AM<BR>     Documentation filed: Problem List

## 2016-10-25 ENCOUNTER — Encounter (HOSPITAL_COMMUNITY): Payer: Self-pay | Admitting: Emergency Medicine

## 2016-10-25 ENCOUNTER — Emergency Department (HOSPITAL_COMMUNITY): Payer: Self-pay

## 2016-10-25 ENCOUNTER — Emergency Department (HOSPITAL_COMMUNITY)
Admission: EM | Admit: 2016-10-25 | Discharge: 2016-10-26 | Disposition: A | Discharge status: Home / Self Care | Payer: Self-pay | Attending: Emergency Medicine | Admitting: Emergency Medicine

## 2016-10-25 DIAGNOSIS — R569 Unspecified convulsions: Secondary | ICD-10-CM | POA: Insufficient documentation

## 2016-10-25 DIAGNOSIS — T407X5A Adverse effect of cannabis (derivatives), initial encounter: Secondary | ICD-10-CM | POA: Insufficient documentation

## 2016-10-25 DIAGNOSIS — T50905A Adverse effect of unspecified drugs, medicaments and biological substances, initial encounter: Secondary | ICD-10-CM

## 2016-10-25 DIAGNOSIS — F1290 Cannabis use, unspecified, uncomplicated: Secondary | ICD-10-CM | POA: Insufficient documentation

## 2016-10-25 LAB — BASIC METABOLIC PANEL
Anion gap: 9 (ref 5–15)
BUN: 12 mg/dL (ref 6–20)
CHLORIDE: 101 mmol/L (ref 101–111)
CO2: 25 mmol/L (ref 22–32)
CREATININE: 1.06 mg/dL — AB (ref 0.44–1.00)
Calcium: 9 mg/dL (ref 8.9–10.3)
Glucose, Bld: 111 mg/dL — ABNORMAL HIGH (ref 65–99)
Potassium: 3.7 mmol/L (ref 3.5–5.1)
SODIUM: 135 mmol/L (ref 135–145)

## 2016-10-25 LAB — CBC WITH DIFFERENTIAL/PLATELET
BASOS PCT: 0 %
Basophils Absolute: 0 10*3/uL (ref 0.0–0.1)
EOS PCT: 0 %
Eosinophils Absolute: 0 10*3/uL (ref 0.0–0.7)
HCT: 38.2 % (ref 36.0–46.0)
HEMOGLOBIN: 12.8 g/dL (ref 12.0–15.0)
Lymphocytes Relative: 12 %
Lymphs Abs: 0.9 10*3/uL (ref 0.7–4.0)
MCH: 30.6 pg (ref 26.0–34.0)
MCHC: 33.5 g/dL (ref 30.0–36.0)
MCV: 91.4 fL (ref 78.0–100.0)
MONOS PCT: 5 %
Monocytes Absolute: 0.4 10*3/uL (ref 0.1–1.0)
NEUTROS PCT: 83 %
Neutro Abs: 6.1 10*3/uL (ref 1.7–7.7)
PLATELETS: 208 10*3/uL (ref 150–400)
RBC: 4.18 MIL/uL (ref 3.87–5.11)
RDW: 12.1 % (ref 11.5–15.5)
WBC: 7.4 10*3/uL (ref 4.0–10.5)

## 2016-10-25 MED ORDER — LORAZEPAM 2 MG/ML IJ SOLN
1.0000 mg | Freq: Once | INTRAMUSCULAR | Status: AC
  Administered 2016-10-25: 1 mg via INTRAVENOUS
  Filled 2016-10-25: qty 1

## 2016-10-25 NOTE — ED Triage Notes (Signed)
Pt states having cannabis oil in a cookie at 2030, she states having uncontrolled shaking in all extremities at 2100 that are still present.  She states not having any symtoms like this before with no history of seizures in the past.  Her wife had the same oil and has none of the symptoms she is expressing.

## 2016-10-25 NOTE — ED Provider Notes (Signed)
[facility] DEPT Provider Note   CSN: [id number] Arrival date & time: 10/25/16  2216     History   Chief Complaint Chief Complaint  Patient presents with  . Seizures    HPI Amanda Baird is a 36 y.o. female.  Patient presents to the ED with a chief complaint of myoclonic jerking.  She states that the symptoms started at about 9:00 PM.  She states that 30 prior to onset, she ate cookies that were baked with cannabis oil.  She denies any history of the same.  She denies any prior seizure history.  Denies any other complaints.     The history is provided by the patient. No language interpreter was used.    No past medical history on file.  Patient Active Problem List   Diagnosis Date Noted  . S/P TAH (total abdominal hysterectomy) 05/26/2015  . Menometrorrhagia 04/23/2015    Past Surgical History:  Procedure Laterality Date  . ABDOMINAL HYSTERECTOMY N/A 05/26/2015   Procedure:  ABDOMINAL HYSTERECTOMY with bilateral salpingectomy.;  Surgeon: Sherlyn Hay, DO;  Location: [facility];  Service: Gynecology;  Laterality: N/A;  . LAPAROSCOPY N/A 05/26/2015   Procedure: LAPAROSCOPY DIAGNOSTIC;  Surgeon: Sherlyn Hay, DO;  Location: [facility];  Service: Gynecology;  Laterality: N/A;  . MYOMECTOMY      OB History    No data available       Home Medications    Prior to Admission medications   Medication Sig Start Date End Date Taking? Authorizing Provider  ibuprofen (ADVIL,MOTRIN) 800 MG tablet Take 1 tablet (800 mg total) by mouth every 8 (eight) hours as needed for mild pain or moderate pain. 05/27/15   Bovard-Stuckert, Jeral Fruit, MD  oxyCODONE-acetaminophen (PERCOCET/ROXICET) 5-325 MG tablet Take 1-2 tablets by mouth every 6 (six) hours as needed for moderate pain or severe pain. 05/27/15   Bovard-StuckertJeral Fruit, MD    Family History No family history on file.  Social History Social History  Substance Use Topics  . Smoking status: Never Smoker  . Smokeless  tobacco: Never Used  . Alcohol use Yes     Comment: occ.     Allergies   Patient has no known allergies.   Review of Systems Review of Systems  All other systems reviewed and are negative.    Physical Exam Updated Vital Signs There were no vitals taken for this visit.  Physical Exam  Constitutional: She is oriented to person, place, and time. She appears well-developed and well-nourished.  HENT:  Head: Normocephalic and atraumatic.  Eyes: Pupils are equal, round, and reactive to light. Conjunctivae and EOM are normal.  Neck: Normal range of motion. Neck supple.  Cardiovascular: Normal rate and regular rhythm.  Exam reveals no gallop and no friction rub.   No murmur heard. Pulmonary/Chest: Effort normal and breath sounds normal. No respiratory distress. She has no wheezes. She has no rales. She exhibits no tenderness.  CTAB  Abdominal: Soft. Bowel sounds are normal. She exhibits no distension and no mass. There is no tenderness. There is no rebound and no guarding.  Musculoskeletal: Normal range of motion. She exhibits no edema or tenderness.  Myoclonic jerking or upper and lower extremity  Neurological: She is alert and oriented to person, place, and time.  Alert and oriented, stuttering speech, but no slurred speech  Skin: Skin is warm and dry.  Psychiatric: She has a normal mood and affect. Her behavior is normal. Judgment and thought content normal.  Nursing note and vitals reviewed.  ED Treatments / Results  Labs (all labs ordered are listed, but only abnormal results are displayed) Labs Reviewed  BASIC METABOLIC PANEL - Abnormal; Notable for the following:       Result Value   Glucose, Bld 111 (*)    Creatinine, Ser 1.06 (*)    All other components within normal limits  CBC WITH DIFFERENTIAL/PLATELET    EKG  EKG Interpretation None       Radiology Ct Head Wo Contrast  Result Date: 10/25/2016 CLINICAL DATA:  Uncontrolled shaking following marijuana  consumption EXAM: CT HEAD WITHOUT CONTRAST TECHNIQUE: Contiguous axial images were obtained from the base of the skull through the vertex without intravenous contrast. COMPARISON:  None. FINDINGS: Brain: No evidence of acute infarction, hemorrhage, hydrocephalus, extra-axial collection or mass lesion/mass effect. Vascular: No hyperdense vessel or unexpected calcification. Skull: Normal. Negative for fracture or focal lesion. Sinuses/Orbits: The visualized paranasal sinuses are essentially clear. The mastoid air cells are unopacified. Other: None. IMPRESSION: Normal head CT. Electronically Signed   By: Julian Hy M.D.   On: 10/25/2016 23:36    Procedures Procedures (including critical care time)  Medications Ordered in ED Medications  LORazepam (ATIVAN) injection 1 mg (1 mg Intravenous Given 10/25/16 2257)     Initial Impression / Assessment and Plan / ED Course  I have reviewed the triage vital signs and the nursing notes.  Pertinent labs & imaging results that were available during my care of the patient were reviewed by me and considered in my medical decision making (see chart for details).     Patient with myoclonic jerking of upper and lower extremities.  Discussed with Dr. Tomi Bamberger, who also saw the patient at the bridge, and determined no code stroke, recommends ativan and monitoring and consider CT head, but not thought to be stroke.   4:08 AM Patient reassessed hourly by me.  Has shown marked improvement with each reassessment.  Now sitting up, talking, eating and drinking.  Able to walk to bathroom.  States that she feels ready to go home.    Final Clinical Impressions(s) / ED Diagnoses   Final diagnoses:  Adverse effect of drug, initial encounter    New Prescriptions New Prescriptions   No medications on file     Montine Circle, Hershal Coria 10/26/16 0429    Dorie Rank, MD 10/28/16 1816

## 2016-10-26 NOTE — Discharge Instructions (Signed)
Please return for any new or worsening symptoms.

## 2017-08-23 IMAGING — CT CT HEAD W/O CM
4 series · 16 of 47 positions shown, 18 images · non-contrast
Comparison: None.

CLINICAL DATA: Uncontrolled shaking following marijuana consumption

EXAM:
CT HEAD WITHOUT CONTRAST
TECHNIQUE: Contiguous axial images were obtained from the base of the skull
through the vertex without intravenous contrast.

[Series 3: head without · axial · non-contrast · 0.43mm/px · z∈[+1347,+1467]mm · 7 of 32 slices shown, 9 images]
[im 4/32  brain]
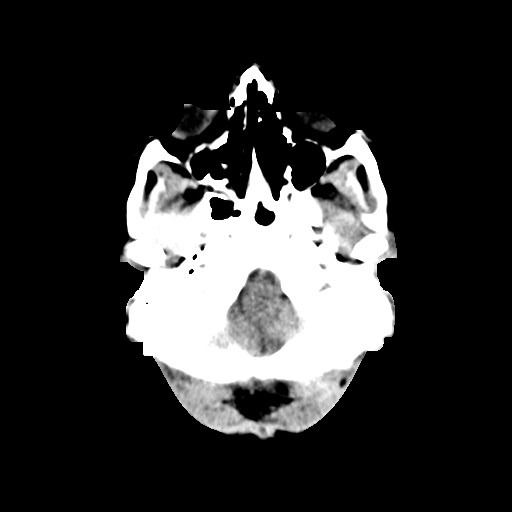
[im 4/32  bone]
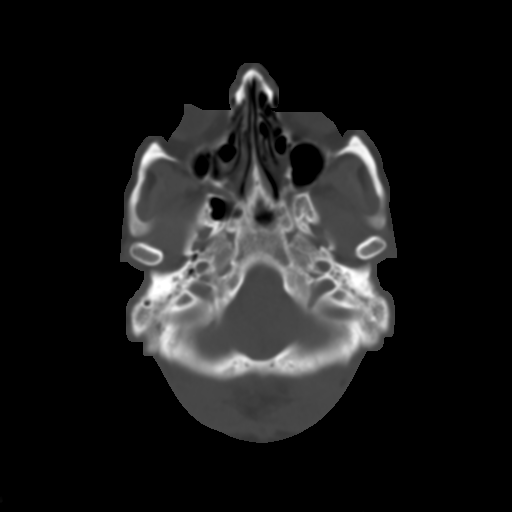
[im 8/32  brain]
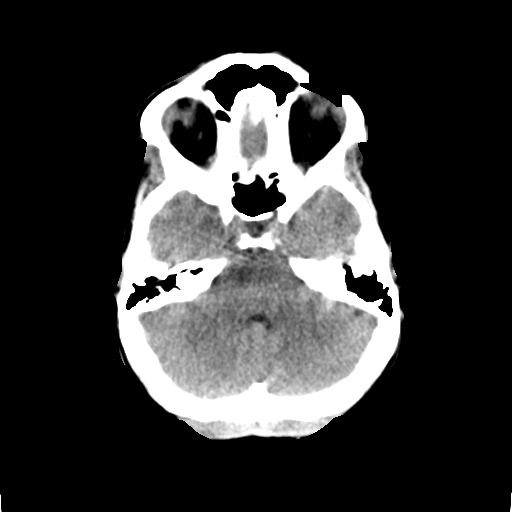
[im 12/32  brain]
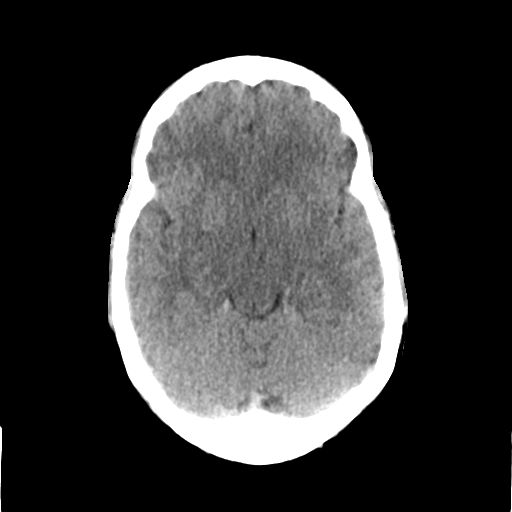
[im 16/32  brain]
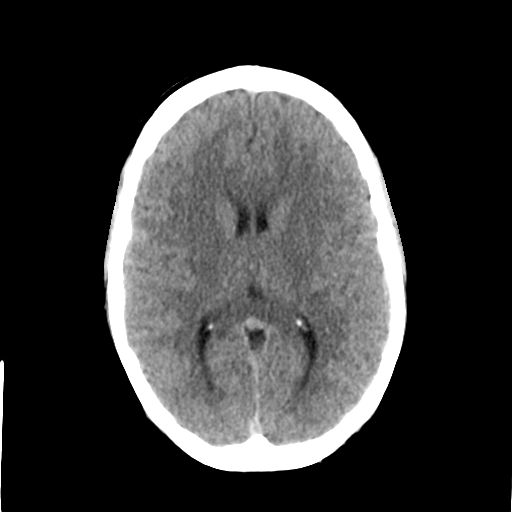
[im 20/32  brain]
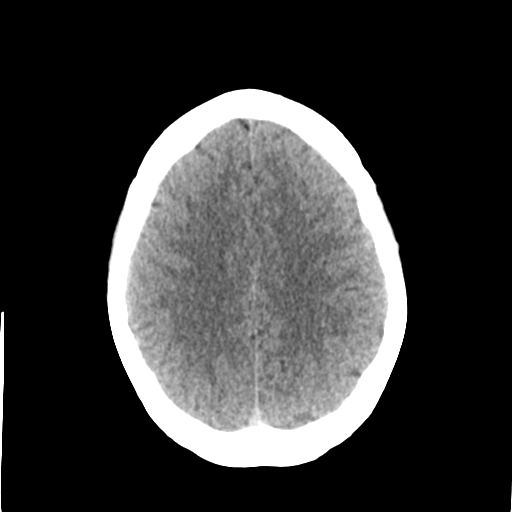
[im 20/32  bone]
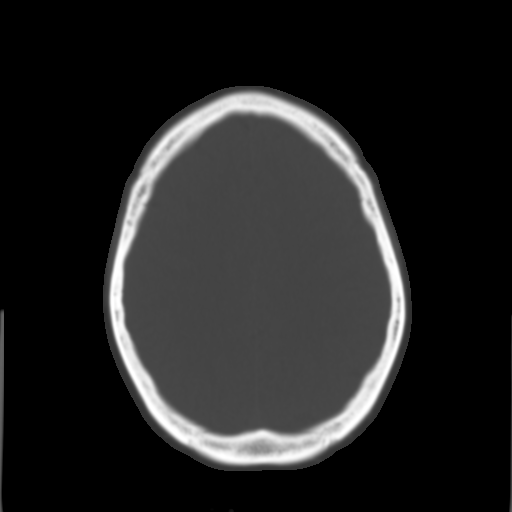
[im 24/32  brain]
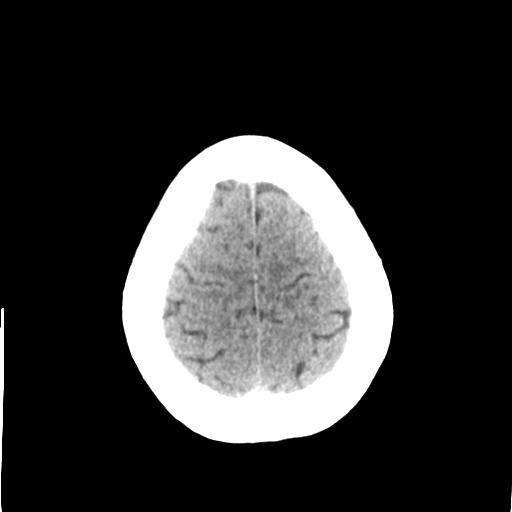
[im 28/32  brain]
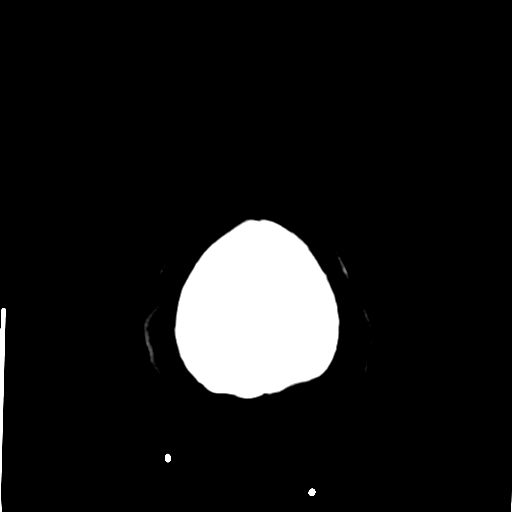

[Series 4: head bone · axial · 0.43mm/px · z∈[+1346,+1378]mm · 3 of 78 slices shown]
[im 8/78  bone]
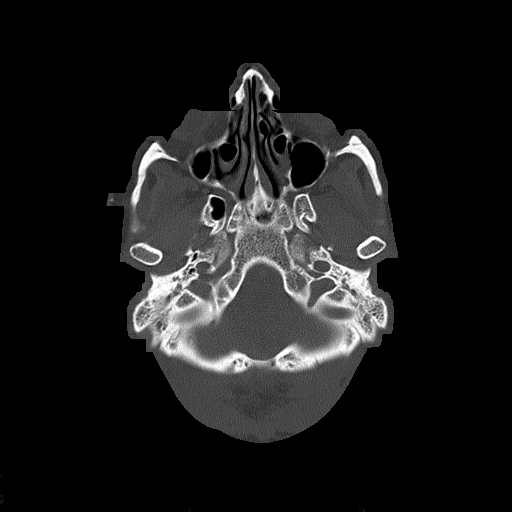
[im 16/78  bone]
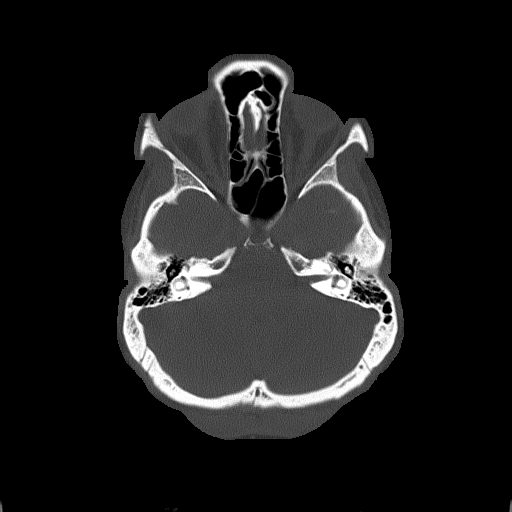
[im 24/78  bone]
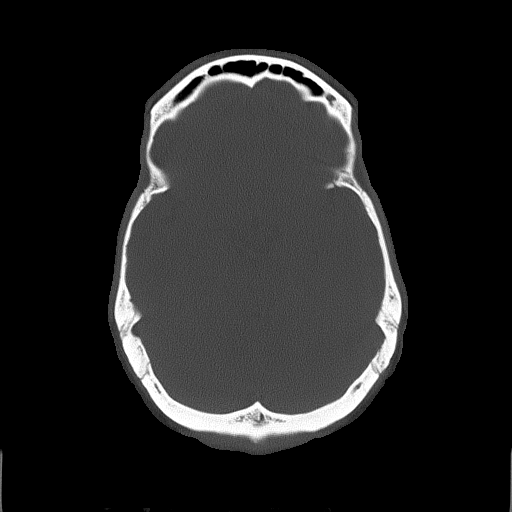

[Series 5: head without cor · coronal · non-contrast · 0.32mm/px · 3 of 67 slices shown]
[im 23/67  brain]
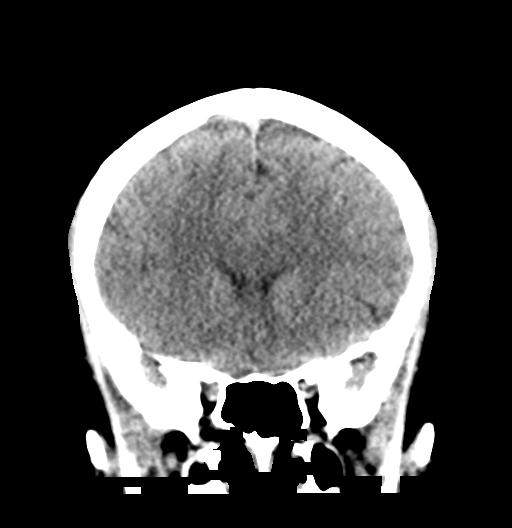
[im 30/67  brain]
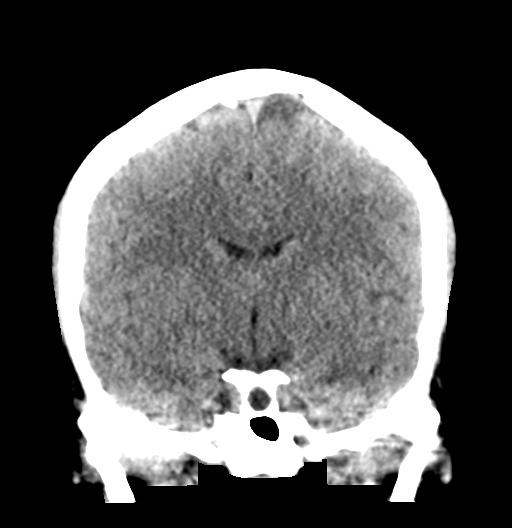
[im 37/67  brain]
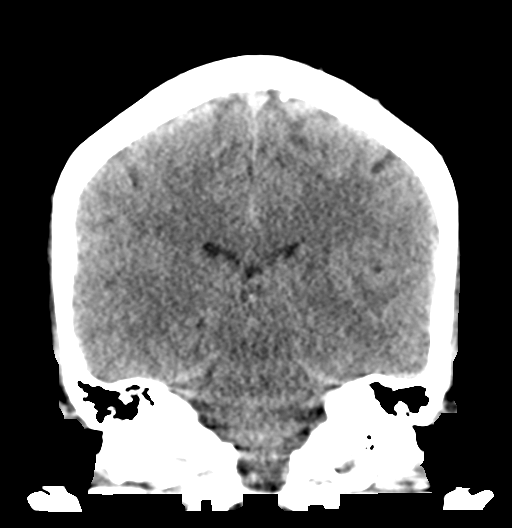

[Series 6: head without sag · sagittal · non-contrast · 0.30mm/px · 3 of 50 slices shown]
[im 17/50  brain]
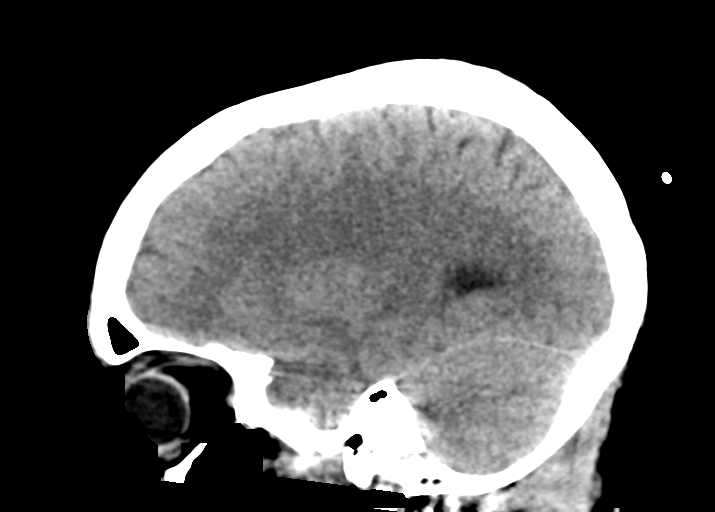
[im 25/50  brain]
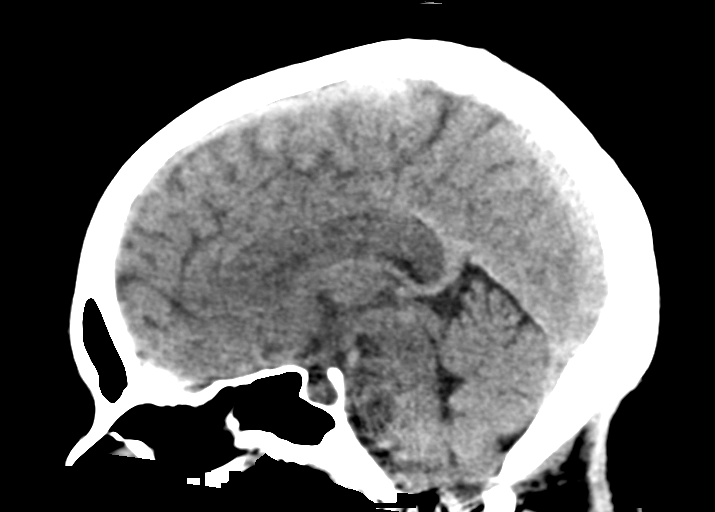
[im 33/50  brain]
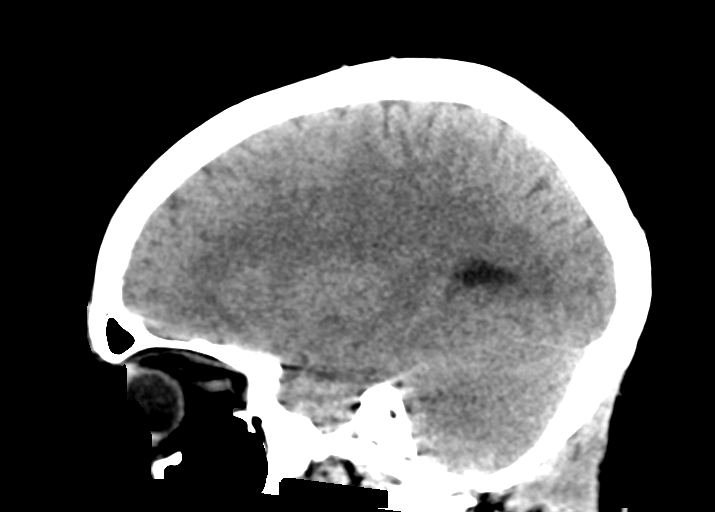

[16 of 47 positions shown; findings below may reference images not displayed]

FINDINGS: Brain: No evidence of acute infarction, hemorrhage, hydrocephalus,
extra-axial collection or mass lesion/mass effect.

Vascular: No hyperdense vessel or unexpected calcification.

Skull: Normal. Negative for fracture or focal lesion.

Sinuses/Orbits: The visualized paranasal sinuses are essentially
clear. The mastoid air cells are unopacified.

Other: None.
IMPRESSION: Normal head CT.

## 2021-01-21 ENCOUNTER — Emergency Department (HOSPITAL_COMMUNITY)
Admission: EM | Admit: 2021-01-21 | Discharge: 2021-01-21 | Disposition: A | Discharge status: Home / Self Care | Payer: PRIVATE HEALTH INSURANCE | Attending: Emergency Medicine | Admitting: Emergency Medicine

## 2021-01-21 DIAGNOSIS — M545 Low back pain, unspecified: Secondary | ICD-10-CM | POA: Diagnosis present

## 2021-01-21 MED ORDER — METHOCARBAMOL 500 MG PO TABS
500.0000 mg | ORAL_TABLET | Freq: Once | ORAL | Status: AC
  Administered 2021-01-21: 500 mg via ORAL
  Filled 2021-01-21: qty 1

## 2021-01-21 MED ORDER — LIDOCAINE 5 % EX PTCH: 1.0000 | MEDICATED_PATCH | CUTANEOUS | 0 refills | Status: DC

## 2021-01-21 MED ORDER — KETOROLAC TROMETHAMINE 30 MG/ML IJ SOLN
30.0000 mg | Freq: Once | INTRAMUSCULAR | Status: AC
  Administered 2021-01-21: 30 mg via INTRAMUSCULAR
  Filled 2021-01-21: qty 1

## 2021-01-21 MED ORDER — METHOCARBAMOL 500 MG PO TABS: 500.0000 mg | ORAL_TABLET | Freq: Two times a day (BID) | ORAL | 0 refills | Status: DC

## 2021-01-21 MED ORDER — NAPROXEN 500 MG PO TABS: 500.0000 mg | ORAL_TABLET | Freq: Two times a day (BID) | ORAL | 0 refills | Status: DC

## 2021-01-21 NOTE — ED Triage Notes (Signed)
PT reports she was changing the babies and her lower back started hurting upon standing. Hx of same x1 year ago. Pain worsens with movement. Denies incontinence.

## 2021-01-21 NOTE — ED Provider Notes (Signed)
[facility] EMERGENCY DEPARTMENT Provider Note  CSN: [id number] Arrival date & time: 01/21/21 1536    History Chief Complaint  Patient presents with   Back Pain    Amanda Baird is a 41 y.o. female with no significant PMH reports she was bending over her bed, changing her 84-year old twins' diapers earlier today when she had sudden onset of diffuse lower back pain. Worse with movement. She does not have any pain radiating into her legs but reports her L>R leg feel tingly. She has not had any falls or injuries. She had a similar pain a few months ago during which she felt a pop but it eventually went away. No fever or incontinence.    No past medical history on file.  Past Surgical History:  Procedure Laterality Date   ABDOMINAL HYSTERECTOMY N/A 05/26/2015   Procedure:  ABDOMINAL HYSTERECTOMY with bilateral salpingectomy.;  Surgeon: Sherlyn Hay, DO;  Location: [facility];  Service: Gynecology;  Laterality: N/A;   LAPAROSCOPY N/A 05/26/2015   Procedure: LAPAROSCOPY DIAGNOSTIC;  Surgeon: Sherlyn Hay, DO;  Location: [facility];  Service: Gynecology;  Laterality: N/A;   MYOMECTOMY      No family history on file.  Social History   Tobacco Use   Smoking status: Never   Smokeless tobacco: Never  Substance Use Topics   Alcohol use: Yes    Comment: occ.   Drug use: No     Home Medications Prior to Admission medications   Medication Sig Start Date End Date Taking? Authorizing Provider  lidocaine (LIDODERM) 5 % Place 1 patch onto the skin daily. Remove & Discard patch within 12 hours or as directed by MD 01/21/21  Yes Truddie Hidden, MD  methocarbamol (ROBAXIN) 500 MG tablet Take 1 tablet (500 mg total) by mouth 2 (two) times daily. 01/21/21  Yes Truddie Hidden, MD  naproxen (NAPROSYN) 500 MG tablet Take 1 tablet (500 mg total) by mouth 2 (two) times daily. 01/21/21  Yes Truddie Hidden, MD  ibuprofen (ADVIL,MOTRIN) 800 MG tablet Take 1 tablet (800 mg  total) by mouth every 8 (eight) hours as needed for mild pain or moderate pain. Patient not taking: Reported on 10/25/2016 05/27/15   Janyth Contes, MD  oxyCODONE-acetaminophen (PERCOCET/ROXICET) 5-325 MG tablet Take 1-2 tablets by mouth every 6 (six) hours as needed for moderate pain or severe pain. Patient not taking: Reported on 10/25/2016 05/27/15   Janyth Contes, MD     Allergies    Patient has no known allergies.   Review of Systems   Review of Systems A comprehensive review of systems was completed and negative except as noted in HPI.    Physical Exam BP 128/71 (BP Location: Left Arm)   Pulse 97   Temp 98.3 F (36.8 C) (Oral)   Resp 16   Ht 5\' 4"  (1.626 m)   Wt 65.8 kg   SpO2 98%   BMI 24.89 kg/m   Physical Exam Vitals and nursing note reviewed.  Constitutional:      Appearance: Normal appearance.  HENT:     Head: Normocephalic and atraumatic.     Nose: Nose normal.     Mouth/Throat:     Mouth: Mucous membranes are moist.  Eyes:     Extraocular Movements: Extraocular movements intact.     Conjunctiva/sclera: Conjunctivae normal.  Cardiovascular:     Rate and Rhythm: Normal rate.  Pulmonary:     Effort: Pulmonary effort is normal.     Breath  sounds: Normal breath sounds.  Abdominal:     General: Abdomen is flat.     Palpations: Abdomen is soft.     Tenderness: There is no abdominal tenderness.  Musculoskeletal:        General: Tenderness (diffuse lumbar paraspinal muscles) present. No swelling. Normal range of motion.     Cervical back: Neck supple.  Skin:    General: Skin is warm and dry.  Neurological:     General: No focal deficit present.     Mental Status: She is alert.     Motor: No weakness.     Gait: Gait abnormal (antalgic gait).  Psychiatric:        Mood and Affect: Mood normal.     ED Results / Procedures / Treatments   Labs (all labs ordered are listed, but only abnormal results are displayed) Labs Reviewed - No data to  display  EKG None  Radiology No results found.  Procedures Procedures  Medications Ordered in the ED Medications  ketorolac (TORADOL) 30 MG/ML injection 30 mg (has no administration in time range)  methocarbamol (ROBAXIN) tablet 500 mg (has no administration in time range)     MDM Rules/Calculators/A&P MDM Patient with MSK back pain, will give Toradol and Robaxin in the ED. Rx for Naprosyn, Robaxin and Lidoderm. Recommend PCP follow up if pain does not improve.   ED Course  I have reviewed the triage vital signs and the nursing notes.  Pertinent labs & imaging results that were available during my care of the patient were reviewed by me and considered in my medical decision making (see chart for details).     Final Clinical Impression(s) / ED Diagnoses Final diagnoses:  Acute bilateral low back pain without sciatica    Rx / DC Orders ED Discharge Orders          Ordered    naproxen (NAPROSYN) 500 MG tablet  2 times daily        01/21/21 1631    methocarbamol (ROBAXIN) 500 MG tablet  2 times daily        01/21/21 1631    lidocaine (LIDODERM) 5 %  Every 24 hours        01/21/21 1631             Truddie Hidden, MD 01/21/21 [phone]

## 2021-04-04 ENCOUNTER — Ambulatory Visit: Payer: PRIVATE HEALTH INSURANCE | Admitting: Physical Therapy

## 2021-04-12 NOTE — Therapy (Signed)
OUTPATIENT PHYSICAL THERAPY THORACOLUMBAR EVALUATION   Patient Name: Amanda Baird MRN: [id number] DOB:02-13-1981, 41 y.o., female Today's Date: 04/13/2021   PT End of Session - 04/13/21 0956     Visit Number 1    Number of Visits 17    Date for PT Re-Evaluation 06/08/21    Authorization Type Primary Physician Care    PT Start Time 0915    PT Stop Time 1000    PT Time Calculation (min) 45 min    Activity Tolerance Patient tolerated treatment well    Behavior During Therapy [facility] for tasks assessed/performed             History reviewed. No pertinent past medical history. Past Surgical History:  Procedure Laterality Date   ABDOMINAL HYSTERECTOMY N/A 05/26/2015   Procedure:  ABDOMINAL HYSTERECTOMY with bilateral salpingectomy.;  Surgeon: Sherlyn Hay, DO;  Location: [facility];  Service: Gynecology;  Laterality: N/A;   LAPAROSCOPY N/A 05/26/2015   Procedure: LAPAROSCOPY DIAGNOSTIC;  Surgeon: Sherlyn Hay, DO;  Location: [facility];  Service: Gynecology;  Laterality: N/A;   MYOMECTOMY     Patient Active Problem List   Diagnosis Date Noted   S/P TAH (total abdominal hysterectomy) 05/26/2015   Menometrorrhagia 04/23/2015    PCP: Percell Belt, DO  REFERRING PROVIDER: Lazoff, Shawn P, DO  REFERRING DIAG: M54.42 (ICD-10-CM) - Lumbago with sciatica, left side  THERAPY DIAG:  Chronic left-sided low back pain with left-sided sciatica  Muscle weakness (generalized)  ONSET DATE: 2017  SUBJECTIVE:                                                                                                                                                                                           SUBJECTIVE STATEMENT: Pt reports primary c/o chronic LBP of insidious onset lasting about 6 years. However, this was recently exacerbated about 1 year ago following an incident when lifting laundry. She reports that she felt and heard a pop when lifting it and was not able to walk afterward  due to pain for about 1 hour. She went to urgent care about 3 days later and received a corticosteroid shot, which helped with the pain for a short time. Following this incident, she presents with LBP with associated Lt LE and buttocks N/T, as well as weakness in the Lt LE. However, the LE sxs can be present without back pain. Aggravating factors include standing from a bent-over or sitting position. She notices it the most when changing her son's diapers. When the pain increases, it only lasts for a few moments before the pain returns to baseline. Prolonged sitting >5-10 minutes can  also increase her Lt LE N/T.  Sleep is also disrupted about twice per night due to pain. Easing factors include rest, pain medication, lidocaine patches, and standing/walking. Current pain is 1/10, worst pain is 5/10, best pain is 1/10. Red flag screening negative.   PERTINENT HISTORY:  N/A  PAIN:  Are you having pain? Yes NPRS scale: 1/10 Pain location: BIL Lt/Rt LBP PAIN TYPE: aching and dull Pain description: intermittent  Aggravating factors: standing from a bent-over or sitting position, prolonged sitting >10 minutes Relieving factors: Pain medications, rest, lidocaine patches, standing and walking  PRECAUTIONS: None  WEIGHT BEARING RESTRICTIONS No  FALLS:  Has patient fallen in last 6 months? No, Number of falls: 0  LIVING ENVIRONMENT: Lives with: lives with their family Lives in: House/apartment Stairs: Yes; Internal: 15 steps; on right going up and External: 3 steps; on right going up Has following equipment at home: None  OCCUPATION: Pt works in [employer], day consists of mostly sitting, although she has been standing and walking around to relieve pain  PLOF: Independent  PATIENT GOALS Return to doing household chores, changing baby's diapers, playing with children   OBJECTIVE:   DIAGNOSTIC FINDINGS:  None related to LBP  PATIENT SURVEYS:  FOTO 51%, projected 63% in 11  visits  SCREENING FOR RED FLAGS: Bowel or bladder incontinence: No Cauda equina syndrome: No  COGNITION:  Overall cognitive status: Within functional limits for tasks assessed     SENSATION:  Light touch: Deficits Lt L2-S1 dermatomes associated with "tingling;" no diminished sensation side to side  MUSCLE LENGTH: Hamstrings: WNL BIL Thomas test: Mild limitation BIL  POSTURE:  Slightly diminished lumbar lordosis  PALPATION: TTP BIL Lt>RT lumbar paraspinals/ QL  PASSIVE ACCESSORIES: CPAs hypomobile and painful from L1-L4  LUMBAR AROM  AROM AROM  04/13/2021  Flexion 70 tightness  Extension 20p!  Right lateral flexion 25p!  Left lateral flexion 25 minor pain  Right rotation 50  Left rotation 55   (Blank rows = not tested)  LE MMT:  MMT Right 04/13/2021 Left 04/13/2021  Hip flexion 4+/5 3+/5  Hip extension 4/5 3+/5  Hip abduction 4+/5 4/5  Knee flexion 5/5 4/5  Knee extension 5/5 4/5  Ankle dorsiflexion 5/5 4+/5  Ankle plantar flexion 5/5 4+/5   (Blank rows = not tested)  LUMBAR SPECIAL TESTS:  Slump: (+) on Lt SLR: (+) on Lt ASLR: (-) Cross SLR: (-) Repeated trunk extension: (-)   FUNCTIONAL TESTS:  Plank test: 18 seconds Squat: WNL    TODAY'S TREATMENT  [facility] Adult PT Treatment:                                                DATE: 04/13/2021 Issued and demonstrated HEP    PATIENT EDUCATION:  Education details: Pt educated on probable pathophysiology behind her pain presentation, prognosis, POC, and HEP Person educated: Patient Education method: Consulting civil engineer, Media planner, and Handouts Education comprehension: verbalized understanding and returned demonstration   HOME EXERCISE PROGRAM: Access Code: [email or web address] URL: [email or web address] Date: 04/13/2021 Prepared by: Vanessa [place]  Exercises Seated Slump Nerve Glide - 1 x daily - 7 x weekly - 3 sets - 20 reps Abdominal Isometric Hold - FEET OFF TABLE* - 1 x daily - 7 x weekly - 3  sets - 30sec hold Prone Press Up - 1 x daily - 7 x weekly - 3  sets - 10 reps Sidelying Hip Abduction - 1 x daily - 7 x weekly - 2 sets - 10 reps - 3-sec hold Supine Bridge - 1 x daily - 7 x weekly - 3 sets - 10 reps - 3-sec hold   ASSESSMENT:  CLINICAL IMPRESSION: Patient is a 41 y.o. F who was seen today for physical therapy evaluation and treatment for chronic LBP with . Upon assessment, the pt's primary impairments include pain with lumbar flexion, extension, and BIL side bending, global Lt LE weakness, BIL hip weakness, weak functional core strength, hypomobile and painful lumbar passive accessories, TTP to BIL lumbar paraspinals, and tingling with Lt LE dermatome testing. These impairments are limiting patient from community activity, occupation, laundry, yard work, shopping, and playing with her children . Ruling up lumbar radiculopathy due to Lt LE N/T associated with her LBP, positive slump testing, positive SLR testing, painful and hypomobile lumbar passive accessories, impaired Lt LE dermatomal testing, and subjective report of pain and increased LE tingling after prolonged sitting, as well as pain relief with walking/ standing. Patient will benefit from skilled PT to address above impairments and improve overall function.  REHAB POTENTIAL: Good  CLINICAL DECISION MAKING: Stable/uncomplicated  EVALUATION COMPLEXITY: Low   GOALS: Goals reviewed with patient? Yes  SHORT TERM GOALS:  STG Name Target Date Goal status  1 Pt will report understanding and adherence to her HEP in order to promote independence in the management of her primary impairments. Baseline: HEP provided at eval 05/11/2021 INITIAL   LONG TERM GOALS:   LTG Name Target Date Goal status  1 Pt will report ability to sit >30 minutes with 0-2/10 LBP and no LE symptoms in order to perform her work duties without regular standing breaks. Baseline: LE N/T after 10 minutes of sitting 06/08/2021 INITIAL  2 Pt will achieve a  63% FOTO score in order to demonstrate improved functional ability as it relates to her LBP. Baseline: 51% 06/08/2021 INITIAL  3 Pt will achieve WNL global lumbar AROM with 0-1/10 pain in order to play with her children without limitation. Baseline: See AROM chart 06/08/2021 INITIAL  4 Pt will achieve global Lt LE strength of 4+/5 or better in order to return to exercising for personal fitness with less limitation. Baseline: See MMT chart 06/08/2021 INITIAL   PLAN: PT FREQUENCY: 2x/week  PT DURATION: 8 weeks  PLANNED INTERVENTIONS: Therapeutic exercises, Therapeutic activity, Neuro Muscular re-education, Balance training, Patient/Family education, Joint mobilization, Dry Needling, Electrical stimulation, Spinal mobilization, Cryotherapy, Moist heat, Taping, Traction, and Manual therapy  PLAN FOR NEXT SESSION: Progress core/ LE strengthening, introduce manual techniques for pain modulation   Vanessa McKinley, PT, DPT 04/13/21 10:39 AM

## 2021-04-13 ENCOUNTER — Ambulatory Visit: Payer: PRIVATE HEALTH INSURANCE | Attending: Family Medicine

## 2021-04-13 ENCOUNTER — Other Ambulatory Visit: Payer: Self-pay

## 2021-04-13 DIAGNOSIS — M6281 Muscle weakness (generalized): Secondary | ICD-10-CM | POA: Insufficient documentation

## 2021-04-13 DIAGNOSIS — G8929 Other chronic pain: Secondary | ICD-10-CM | POA: Insufficient documentation

## 2021-04-13 DIAGNOSIS — M5442 Lumbago with sciatica, left side: Secondary | ICD-10-CM | POA: Diagnosis not present

## 2021-04-19 ENCOUNTER — Ambulatory Visit: Payer: PRIVATE HEALTH INSURANCE

## 2021-04-20 NOTE — Therapy (Signed)
OUTPATIENT PHYSICAL THERAPY TREATMENT NOTE   Patient Name: Amanda Baird MRN: [id number] DOB:Jul 09, 1980, 41 y.o., female 18 Date: 04/21/2021  PCP: Percell Belt, DO REFERRING PROVIDER: Percell Belt, DO   PT End of Session - 04/21/21 0918     Visit Number 2    Number of Visits 17    Date for PT Re-Evaluation 06/08/21    Authorization Type Primary Physician Care    PT Start Time [phone]    PT Stop Time 1000    PT Time Calculation (min) 42 min    Activity Tolerance Patient tolerated treatment well    Behavior During Therapy [facility] for tasks assessed/performed             History reviewed. No pertinent past medical history. Past Surgical History:  Procedure Laterality Date   ABDOMINAL HYSTERECTOMY N/A 05/26/2015   Procedure:  ABDOMINAL HYSTERECTOMY with bilateral salpingectomy.;  Surgeon: Sherlyn Hay, DO;  Location: [facility];  Service: Gynecology;  Laterality: N/A;   LAPAROSCOPY N/A 05/26/2015   Procedure: LAPAROSCOPY DIAGNOSTIC;  Surgeon: Sherlyn Hay, DO;  Location: [facility];  Service: Gynecology;  Laterality: N/A;   MYOMECTOMY     Patient Active Problem List   Diagnosis Date Noted   S/P TAH (total abdominal hysterectomy) 05/26/2015   Menometrorrhagia 04/23/2015    REFERRING DIAG: M54.42 (ICD-10-CM) - Lumbago with sciatica, left side  THERAPY DIAG:  Chronic left-sided low back pain with left-sided sciatica  Muscle weakness (generalized)   SUBJECTIVE: Pt reports adherence to her HEP. She reports that her pain hasn't changed since her eval.  PAIN:  Are you having pain? Yes NPRS scale: 2/10 Pain location: Rt low back PAIN TYPE: aching Pain description: intermittent      OBJECTIVE:   *Unless otherwise noted, objective measures collected previously* DIAGNOSTIC FINDINGS:  None related to LBP   PATIENT SURVEYS:  FOTO 51%, projected 63% in 11 visits   SCREENING FOR RED FLAGS: Bowel or bladder incontinence: No Cauda equina syndrome: No    COGNITION:          Overall cognitive status: Within functional limits for tasks assessed                        SENSATION:          Light touch: Deficits Lt L2-S1 dermatomes associated with "tingling;" no diminished sensation side to side   MUSCLE LENGTH: Hamstrings: WNL BIL Thomas test: Mild limitation BIL   POSTURE:  Slightly diminished lumbar lordosis   PALPATION: TTP BIL Lt>RT lumbar paraspinals/ QL   PASSIVE ACCESSORIES: CPAs hypomobile and painful from L1-L4   LUMBAR AROM   AROM AROM  04/13/2021  Flexion 70 tightness  Extension 20p!  Right lateral flexion 25p!  Left lateral flexion 25 minor pain  Right rotation 50  Left rotation 55   (Blank rows = not tested)   LE MMT:   MMT Right 04/13/2021 Left 04/13/2021  Hip flexion 4+/5 3+/5  Hip extension 4/5 3+/5  Hip abduction 4+/5 4/5  Knee flexion 5/5 4/5  Knee extension 5/5 4/5  Ankle dorsiflexion 5/5 4+/5  Ankle plantar flexion 5/5 4+/5   (Blank rows = not tested)   LUMBAR SPECIAL TESTS:  Slump: (+) on Lt SLR: (+) on Lt ASLR: (-) Cross SLR: (-) Repeated trunk extension: (-)     FUNCTIONAL TESTS:  Plank test: 18 seconds Squat: WNL       TODAY'S TREATMENT   [facility] Adult PT Treatment:  DATE: 04/21/2021 Therapeutic Exercise: Knee plank 3x30 seconds Prone cobra stretch 3x20 seconds Dead bugs 3x16 Hooklying trunk rotation 3x10 BIL Standing Cybex hip abduction with 25# cable 2x10 BIL Standing Cybex hip extension with 25# cable 2x10 BIL Standing Pallof press with 7# cable 2x10 with 5-sec hold BIL Standing open book stretch 2x10 BIL Side knee plank with clam 2x10 BIL Manual Therapy: N/A Neuromuscular re-ed: N/A Therapeutic Activity: N/A Modalities: N/A Self Care: N/A   [facility] Adult PT Treatment:                                                DATE: 04/13/2021 Issued and demonstrated HEP       PATIENT EDUCATION:  Education details: Pt educated on  probable pathophysiology behind her pain presentation, prognosis, POC, and HEP Person educated: Patient Education method: Consulting civil engineer, Media planner, and Handouts Education comprehension: verbalized understanding and returned demonstration     HOME EXERCISE PROGRAM: Access Code: [email or web address] URL: [email or web address] Date: 04/13/2021 Prepared by: Vanessa [place]   Exercises Seated Slump Nerve Glide - 1 x daily - 7 x weekly - 3 sets - 20 reps Abdominal Isometric Hold - FEET OFF TABLE* - 1 x daily - 7 x weekly - 3 sets - 30sec hold Prone Press Up - 1 x daily - 7 x weekly - 3 sets - 10 reps Sidelying Hip Abduction - 1 x daily - 7 x weekly - 2 sets - 10 reps - 3-sec hold Supine Bridge - 1 x daily - 7 x weekly - 3 sets - 10 reps - 3-sec hold     ASSESSMENT:   CLINICAL IMPRESSION: Pt responded well to initial exercise program today, demonstrating good form and no increase in pain with selected exercises. She reports that today's exercises presented a good challenge, and she states she is moderately fatigued to end the session. She will continue to benefit from skilled PT to address her primary impairments and return to her prior level of function with less limitation.   REHAB POTENTIAL: Good   CLINICAL DECISION MAKING: Stable/uncomplicated   EVALUATION COMPLEXITY: Low     GOALS: Goals reviewed with patient? Yes   SHORT TERM GOALS:   STG Name Target Date Goal status  1 Pt will report understanding and adherence to her HEP in order to promote independence in the management of her primary impairments. Baseline: HEP provided at eval 04/21/2021: Pt reports daily adherence to her HEP 05/11/2021 ACHIEVED    LONG TERM GOALS:    LTG Name Target Date Goal status  1 Pt will report ability to sit >30 minutes with 0-2/10 LBP and no LE symptoms in order to perform her work duties without regular standing breaks. Baseline: LE N/T after 10 minutes of sitting 06/08/2021 INITIAL  2 Pt  will achieve a 63% FOTO score in order to demonstrate improved functional ability as it relates to her LBP. Baseline: 51% 06/08/2021 INITIAL  3 Pt will achieve WNL global lumbar AROM with 0-1/10 pain in order to play with her children without limitation. Baseline: See AROM chart 06/08/2021 INITIAL  4 Pt will achieve global Lt LE strength of 4+/5 or better in order to return to exercising for personal fitness with less limitation. Baseline: See MMT chart 06/08/2021 INITIAL    PLAN: PT FREQUENCY: 2x/week   PT DURATION: 8 weeks   PLANNED INTERVENTIONS: Therapeutic exercises, Therapeutic  activity, Neuro Muscular re-education, Balance training, Patient/Family education, Joint mobilization, Dry Needling, Electrical stimulation, Spinal mobilization, Cryotherapy, Moist heat, Taping, Traction, and Manual therapy   PLAN FOR NEXT SESSION: Progress core/ LE strengthening, introduce manual techniques for pain modulation      Vanessa [place], PT, DPT 04/21/21 9:58 AM

## 2021-04-21 ENCOUNTER — Ambulatory Visit: Payer: PRIVATE HEALTH INSURANCE

## 2021-04-21 ENCOUNTER — Other Ambulatory Visit: Payer: Self-pay

## 2021-04-21 DIAGNOSIS — G8929 Other chronic pain: Secondary | ICD-10-CM

## 2021-04-21 DIAGNOSIS — M6281 Muscle weakness (generalized): Secondary | ICD-10-CM

## 2021-04-21 DIAGNOSIS — M5442 Lumbago with sciatica, left side: Secondary | ICD-10-CM | POA: Diagnosis not present

## 2021-04-22 NOTE — Therapy (Signed)
OUTPATIENT PHYSICAL THERAPY TREATMENT NOTE   Patient Name: Amanda Baird MRN: [id number] DOB:21-May-1980, 41 y.o., female Today's Date: 04/25/2021  PCP: Percell Belt, DO REFERRING PROVIDER: Percell Belt, DO   PT End of Session - 04/25/21 0911     Visit Number 3    Number of Visits 17    Date for PT Re-Evaluation 06/08/21    Authorization Type Primary Physician Care    PT Start Time 0915    PT Stop Time 1000    PT Time Calculation (min) 45 min    Activity Tolerance Patient tolerated treatment well    Behavior During Therapy [facility] for tasks assessed/performed              History reviewed. No pertinent past medical history. Past Surgical History:  Procedure Laterality Date   ABDOMINAL HYSTERECTOMY N/A 05/26/2015   Procedure:  ABDOMINAL HYSTERECTOMY with bilateral salpingectomy.;  Surgeon: Sherlyn Hay, DO;  Location: [facility];  Service: Gynecology;  Laterality: N/A;   LAPAROSCOPY N/A 05/26/2015   Procedure: LAPAROSCOPY DIAGNOSTIC;  Surgeon: Sherlyn Hay, DO;  Location: [facility];  Service: Gynecology;  Laterality: N/A;   MYOMECTOMY     Patient Active Problem List   Diagnosis Date Noted   S/P TAH (total abdominal hysterectomy) 05/26/2015   Menometrorrhagia 04/23/2015    REFERRING DIAG: M54.42 (ICD-10-CM) - Lumbago with sciatica, left side  THERAPY DIAG:  Chronic left-sided low back pain with left-sided sciatica  Muscle weakness (generalized)   SUBJECTIVE: Pt reports continued 1/10 pain today that she describes as primarily stiffness. She reports no pain when bending over. She adds that she has been doing her HEP daily.  PAIN:  Are you having pain? Yes NPRS scale: 1/10 Pain location: Rt low back PAIN TYPE: Tightness Pain description: intermittent      OBJECTIVE:   *Unless otherwise noted, objective measures collected previously* DIAGNOSTIC FINDINGS:  None related to LBP   PATIENT SURVEYS:  FOTO 51%, projected 63% in 11 visits    SCREENING FOR RED FLAGS: Bowel or bladder incontinence: No Cauda equina syndrome: No   COGNITION:          Overall cognitive status: Within functional limits for tasks assessed                        SENSATION:          Light touch: Deficits Lt L2-S1 dermatomes associated with "tingling;" no diminished sensation side to side   MUSCLE LENGTH: Hamstrings: WNL BIL Thomas test: Mild limitation BIL   POSTURE:  Slightly diminished lumbar lordosis   PALPATION: TTP BIL Lt>RT lumbar paraspinals/ QL   PASSIVE ACCESSORIES: CPAs hypomobile and painful from L1-L4   LUMBAR AROM   AROM AROM  04/13/2021 AROM 04/25/2021  Flexion 70 tightness 75 minor p!  Extension 20p! 20 minor p!  Right lateral flexion 25p! 35 minor p!  Left lateral flexion 25 minor pain 35 minor p!  Right rotation 50   Left rotation 55    (Blank rows = not tested)   LE MMT:   MMT Right 04/13/2021 Left 04/13/2021  Hip flexion 4+/5 3+/5  Hip extension 4/5 3+/5  Hip abduction 4+/5 4/5  Knee flexion 5/5 4/5  Knee extension 5/5 4/5  Ankle dorsiflexion 5/5 4+/5  Ankle plantar flexion 5/5 4+/5   (Blank rows = not tested)   LUMBAR SPECIAL TESTS:  Slump: (+) on Lt SLR: (+) on Lt ASLR: (-) Cross SLR: (-) Repeated  trunk extension: (-)     FUNCTIONAL TESTS:  Plank test: 18 seconds Squat: WNL       TODAY'S TREATMENT   [facility] Adult PT Treatment:                                                DATE: 04/25/2021 Therapeutic Exercise: Knee plank 3x30 seconds Prone cobra stretch 3x20 seconds Standing Cybex hip abduction with 30# cable 2x10 BIL Standing Cybex hip extension with 30# cable 2x10 BIL Standing Pallof press with 7# cable 2x10 with 5-sec hold BIL Standing open book stretch 2x10 BIL Side knee plank with clam with YTB around knees 2x10 BIL Standing trunk side bend with 23# cable 2x10 BIL Standing windmill stretch 2x10 BIL Traditional dead lift with 65# barbell 3x8 Manual Therapy: N/A Neuromuscular  re-ed: N/A Therapeutic Activity: N/A Modalities: N/A Self Care: N/A   [facility] Adult PT Treatment:                                                DATE: 04/21/2021 Therapeutic Exercise: Knee plank 3x30 seconds Prone cobra stretch 3x20 seconds Dead bugs 3x16 Hooklying trunk rotation 3x10 BIL Standing Cybex hip abduction with 25# cable 2x10 BIL Standing Cybex hip extension with 25# cable 2x10 BIL Standing Pallof press with 7# cable 2x10 with 5-sec hold BIL Standing open book stretch 2x10 BIL Side knee plank with clam 2x10 BIL Manual Therapy: N/A Neuromuscular re-ed: N/A Therapeutic Activity: N/A Modalities: N/A Self Care: N/A   [facility] Adult PT Treatment:                                                DATE: 04/13/2021 Issued and demonstrated HEP       PATIENT EDUCATION:  Education details: Pt educated on probable pathophysiology behind her pain presentation, prognosis, POC, and HEP Person educated: Patient Education method: Consulting civil engineer, Media planner, and Handouts Education comprehension: verbalized understanding and returned demonstration     HOME EXERCISE PROGRAM: Access Code: [email or web address] URL: [email or web address] Date: 04/13/2021 Prepared by: Vanessa [place]   Exercises Seated Slump Nerve Glide - 1 x daily - 7 x weekly - 3 sets - 20 reps Abdominal Isometric Hold - FEET OFF TABLE* - 1 x daily - 7 x weekly - 3 sets - 30sec hold Prone Press Up - 1 x daily - 7 x weekly - 3 sets - 10 reps Sidelying Hip Abduction - 1 x daily - 7 x weekly - 2 sets - 10 reps - 3-sec hold Supine Bridge - 1 x daily - 7 x weekly - 3 sets - 10 reps - 3-sec hold     ASSESSMENT:   CLINICAL IMPRESSION: Pt responded well to all interventions today, demonstrating good form and no increase in pain with selected exercises. Of note, the pt has seen improvement in lumbar flexion and BIL side bend AROM, although these motions are still minimally painful. She will continue to benefit from  skilled PT to address her primary impairments and return to her prior level of function with less limitation.   REHAB POTENTIAL: Good   CLINICAL DECISION  MAKING: Stable/uncomplicated   EVALUATION COMPLEXITY: Low     GOALS: Goals reviewed with patient? Yes   SHORT TERM GOALS:   STG Name Target Date Goal status  1 Pt will report understanding and adherence to her HEP in order to promote independence in the management of her primary impairments. Baseline: HEP provided at eval 04/21/2021: Pt reports daily adherence to her HEP 05/11/2021 ACHIEVED    LONG TERM GOALS:    LTG Name Target Date Goal status  1 Pt will report ability to sit >30 minutes with 0-2/10 LBP and no LE symptoms in order to perform her work duties without regular standing breaks. Baseline: LE N/T after 10 minutes of sitting 06/08/2021 INITIAL  2 Pt will achieve a 63% FOTO score in order to demonstrate improved functional ability as it relates to her LBP. Baseline: 51% 06/08/2021 INITIAL  3 Pt will achieve WNL global lumbar AROM with 0-1/10 pain in order to play with her children without limitation. Baseline: See AROM chart 04/25/2021: See updated AROM chart 06/08/2021 PROGRESSING  4 Pt will achieve global Lt LE strength of 4+/5 or better in order to return to exercising for personal fitness with less limitation. Baseline: See MMT chart 06/08/2021 INITIAL    PLAN: PT FREQUENCY: 2x/week   PT DURATION: 8 weeks   PLANNED INTERVENTIONS: Therapeutic exercises, Therapeutic activity, Neuro Muscular re-education, Balance training, Patient/Family education, Joint mobilization, Dry Needling, Electrical stimulation, Spinal mobilization, Cryotherapy, Moist heat, Taping, Traction, and Manual therapy   PLAN FOR NEXT SESSION: Progress core/ LE strengthening, introduce manual techniques for pain modulation      Vanessa [place], PT, DPT 04/25/21 9:59 AM

## 2021-04-25 ENCOUNTER — Ambulatory Visit: Payer: PRIVATE HEALTH INSURANCE

## 2021-04-25 ENCOUNTER — Other Ambulatory Visit: Payer: Self-pay

## 2021-04-25 DIAGNOSIS — G8929 Other chronic pain: Secondary | ICD-10-CM

## 2021-04-25 DIAGNOSIS — M6281 Muscle weakness (generalized): Secondary | ICD-10-CM

## 2021-04-25 DIAGNOSIS — M5442 Lumbago with sciatica, left side: Secondary | ICD-10-CM | POA: Diagnosis not present

## 2021-04-28 ENCOUNTER — Ambulatory Visit: Payer: PRIVATE HEALTH INSURANCE

## 2021-04-28 NOTE — Therapy (Incomplete)
OUTPATIENT PHYSICAL THERAPY TREATMENT NOTE   Patient Name: Amanda Baird MRN: [id number] DOB:Apr 27, 1980, 41 y.o., female Today's Date: 04/28/2021  PCP: Percell Belt, DO REFERRING PROVIDER: Rayann Heman, Shawn P, DO      No past medical history on file. Past Surgical History:  Procedure Laterality Date   ABDOMINAL HYSTERECTOMY N/A 05/26/2015   Procedure:  ABDOMINAL HYSTERECTOMY with bilateral salpingectomy.;  Surgeon: Sherlyn Hay, DO;  Location: [facility];  Service: Gynecology;  Laterality: N/A;   LAPAROSCOPY N/A 05/26/2015   Procedure: LAPAROSCOPY DIAGNOSTIC;  Surgeon: Sherlyn Hay, DO;  Location: [facility];  Service: Gynecology;  Laterality: N/A;   MYOMECTOMY     Patient Active Problem List   Diagnosis Date Noted   S/P TAH (total abdominal hysterectomy) 05/26/2015   Menometrorrhagia 04/23/2015    REFERRING DIAG: M54.42 (ICD-10-CM) - Lumbago with sciatica, left side  THERAPY DIAG:  No diagnosis found.   SUBJECTIVE: ***  Pt reports continued 1/10 pain today that she describes as primarily stiffness. She reports no pain when bending over. She adds that she has been doing her HEP daily.  PAIN:  Are you having pain? Yes NPRS scale: ***/10 Pain location: Rt low back PAIN TYPE: Tightness Pain description: intermittent      OBJECTIVE:   *Unless otherwise noted, objective measures collected previously* DIAGNOSTIC FINDINGS:  None related to LBP   PATIENT SURVEYS:  FOTO 51%, projected 63% in 11 visits   SCREENING FOR RED FLAGS: Bowel or bladder incontinence: No Cauda equina syndrome: No   COGNITION:          Overall cognitive status: Within functional limits for tasks assessed                        SENSATION:          Light touch: Deficits Lt L2-S1 dermatomes associated with "tingling;" no diminished sensation side to side   MUSCLE LENGTH: Hamstrings: WNL BIL Thomas test: Mild limitation BIL   POSTURE:  Slightly diminished lumbar lordosis    PALPATION: TTP BIL Lt>RT lumbar paraspinals/ QL   PASSIVE ACCESSORIES: CPAs hypomobile and painful from L1-L4   LUMBAR AROM   AROM AROM  04/13/2021 AROM 04/25/2021  Flexion 70 tightness 75 minor p!  Extension 20p! 20 minor p!  Right lateral flexion 25p! 35 minor p!  Left lateral flexion 25 minor pain 35 minor p!  Right rotation 50   Left rotation 55    (Blank rows = not tested)   LE MMT:   MMT Right 04/13/2021 Left 04/13/2021  Hip flexion 4+/5 3+/5  Hip extension 4/5 3+/5  Hip abduction 4+/5 4/5  Knee flexion 5/5 4/5  Knee extension 5/5 4/5  Ankle dorsiflexion 5/5 4+/5  Ankle plantar flexion 5/5 4+/5   (Blank rows = not tested)   LUMBAR SPECIAL TESTS:  Slump: (+) on Lt SLR: (+) on Lt ASLR: (-) Cross SLR: (-) Repeated trunk extension: (-)     FUNCTIONAL TESTS:  Plank test: 18 seconds Squat: WNL       TODAY'S TREATMENT  [facility] Adult PT Treatment:                                                DATE: 04/28/2021 Therapeutic Exercise: Knee plank 3x30 seconds Prone cobra stretch 3x20 seconds Bird dogs 2 x 10 Side knee plank  with clam with YTB around knees 2x10 BIL Standing Cybex hip abduction with 30# cable 2x10 BIL Standing Cybex hip extension with 30# cable 2x10 BIL Standing Cybex hip flexion with 30# cable 2x10 BIL Standing Pallof press with 10# cable 2x10 with 5-sec hold BIL Standing trunk side bend with 23# cable 2x10 BIL Chops with 10# cable x10 BIL Revere chops with 10# cable x10 BIL Standing open book stretch 2x10 BIL Standing windmill stretch 2x10 BIL Traditional dead lift with 65# barbell 3x8    [facility] Adult PT Treatment:                                                DATE: 04/25/2021 Therapeutic Exercise: Knee plank 3x30 seconds Prone cobra stretch 3x20 seconds Standing Cybex hip abduction with 30# cable 2x10 BIL Standing Cybex hip extension with 30# cable 2x10 BIL Standing Pallof press with 7# cable 2x10 with 5-sec hold BIL Standing open book  stretch 2x10 BIL Side knee plank with clam with YTB around knees 2x10 BIL Standing trunk side bend with 23# cable 2x10 BIL Standing windmill stretch 2x10 BIL Traditional dead lift with 65# barbell 3x8 Manual Therapy: N/A Neuromuscular re-ed: N/A Therapeutic Activity: N/A Modalities: N/A Self Care: N/A   [facility] Adult PT Treatment:                                                DATE: 04/21/2021 Therapeutic Exercise: Knee plank 3x30 seconds Prone cobra stretch 3x20 seconds Dead bugs 3x16 Hooklying trunk rotation 3x10 BIL Standing Cybex hip abduction with 25# cable 2x10 BIL Standing Cybex hip extension with 25# cable 2x10 BIL Standing Pallof press with 7# cable 2x10 with 5-sec hold BIL Standing open book stretch 2x10 BIL Side knee plank with clam 2x10 BIL Manual Therapy: N/A Neuromuscular re-ed: N/A Therapeutic Activity: N/A Modalities: N/A Self Care: N/A       PATIENT EDUCATION:  Education details: Pt educated on probable pathophysiology behind her pain presentation, prognosis, POC, and HEP Person educated: Patient Education method: Consulting civil engineer, Demonstration, and Handouts Education comprehension: verbalized understanding and returned demonstration     HOME EXERCISE PROGRAM: Access Code: [email or web address] URL: [email or web address] Date: 04/13/2021 Prepared by: Vanessa [place]   Exercises Seated Slump Nerve Glide - 1 x daily - 7 x weekly - 3 sets - 20 reps Abdominal Isometric Hold - FEET OFF TABLE* - 1 x daily - 7 x weekly - 3 sets - 30sec hold Prone Press Up - 1 x daily - 7 x weekly - 3 sets - 10 reps Sidelying Hip Abduction - 1 x daily - 7 x weekly - 2 sets - 10 reps - 3-sec hold Supine Bridge - 1 x daily - 7 x weekly - 3 sets - 10 reps - 3-sec hold     ASSESSMENT:   CLINICAL IMPRESSION: ***  Pt responded well to all interventions today, demonstrating good form and no increase in pain with selected exercises. Of note, the pt has seen improvement  in lumbar flexion and BIL side bend AROM, although these motions are still minimally painful. She will continue to benefit from skilled PT to address her primary impairments and return to her prior level of function with less limitation.   REHAB  POTENTIAL: Good   CLINICAL DECISION MAKING: Stable/uncomplicated   EVALUATION COMPLEXITY: Low     GOALS: Goals reviewed with patient? Yes   SHORT TERM GOALS:   STG Name Target Date Goal status  1 Pt will report understanding and adherence to her HEP in order to promote independence in the management of her primary impairments. Baseline: HEP provided at eval 04/21/2021: Pt reports daily adherence to her HEP 05/11/2021 ACHIEVED    LONG TERM GOALS:    LTG Name Target Date Goal status  1 Pt will report ability to sit >30 minutes with 0-2/10 LBP and no LE symptoms in order to perform her work duties without regular standing breaks. Baseline: LE N/T after 10 minutes of sitting 06/08/2021 INITIAL  2 Pt will achieve a 63% FOTO score in order to demonstrate improved functional ability as it relates to her LBP. Baseline: 51% 06/08/2021 INITIAL  3 Pt will achieve WNL global lumbar AROM with 0-1/10 pain in order to play with her children without limitation. Baseline: See AROM chart 04/25/2021: See updated AROM chart 06/08/2021 PROGRESSING  4 Pt will achieve global Lt LE strength of 4+/5 or better in order to return to exercising for personal fitness with less limitation. Baseline: See MMT chart 06/08/2021 INITIAL    PLAN: PT FREQUENCY: 2x/week   PT DURATION: 8 weeks   PLANNED INTERVENTIONS: Therapeutic exercises, Therapeutic activity, Neuro Muscular re-education, Balance training, Patient/Family education, Joint mobilization, Dry Needling, Electrical stimulation, Spinal mobilization, Cryotherapy, Moist heat, Taping, Traction, and Manual therapy   PLAN FOR NEXT SESSION: *** Progress core/ LE strengthening, introduce manual techniques for pain modulation      Evelene Croon, PTA 04/28/21 9:25 AM

## 2021-05-02 ENCOUNTER — Ambulatory Visit: Payer: PRIVATE HEALTH INSURANCE

## 2021-05-02 ENCOUNTER — Other Ambulatory Visit: Payer: Self-pay

## 2021-05-02 DIAGNOSIS — G8929 Other chronic pain: Secondary | ICD-10-CM

## 2021-05-02 DIAGNOSIS — M5442 Lumbago with sciatica, left side: Secondary | ICD-10-CM | POA: Diagnosis not present

## 2021-05-02 DIAGNOSIS — M6281 Muscle weakness (generalized): Secondary | ICD-10-CM

## 2021-05-02 NOTE — Therapy (Signed)
OUTPATIENT PHYSICAL THERAPY TREATMENT NOTE   Patient Name: Amanda Baird MRN: [id number] DOB:05-18-1980, 41 y.o., female 65 Date: 05/02/2021  PCP: Percell Belt, DO REFERRING PROVIDER: Percell Belt, DO   PT End of Session - 05/02/21 0916     Visit Number 4    Number of Visits 17    Date for PT Re-Evaluation 06/08/21    Authorization Type Primary Physician Care    PT Start Time 0915    PT Stop Time 0957    PT Time Calculation (min) 42 min    Activity Tolerance Patient tolerated treatment well    Behavior During Therapy [facility] for tasks assessed/performed               History reviewed. No pertinent past medical history. Past Surgical History:  Procedure Laterality Date   ABDOMINAL HYSTERECTOMY N/A 05/26/2015   Procedure:  ABDOMINAL HYSTERECTOMY with bilateral salpingectomy.;  Surgeon: Sherlyn Hay, DO;  Location: [facility];  Service: Gynecology;  Laterality: N/A;   LAPAROSCOPY N/A 05/26/2015   Procedure: LAPAROSCOPY DIAGNOSTIC;  Surgeon: Sherlyn Hay, DO;  Location: [facility];  Service: Gynecology;  Laterality: N/A;   MYOMECTOMY     Patient Active Problem List   Diagnosis Date Noted   S/P TAH (total abdominal hysterectomy) 05/26/2015   Menometrorrhagia 04/23/2015    REFERRING DIAG: M54.42 (ICD-10-CM) - Lumbago with sciatica, left side  THERAPY DIAG:  Chronic left-sided low back pain with left-sided sciatica  Muscle weakness (generalized)   SUBJECTIVE: I was a little sore after the last session and have noticed a difference when I do my stretches at home.  PAIN:  Are you having pain? Yes NPRS scale: 1/10 Pain location: Rt low back PAIN TYPE: Tightness Pain description: intermittent      OBJECTIVE:   *Unless otherwise noted, objective measures collected previously* DIAGNOSTIC FINDINGS:  None related to LBP   PATIENT SURVEYS:  FOTO 51%, projected 63% in 11 visits   SCREENING FOR RED FLAGS: Bowel or bladder incontinence: No Cauda  equina syndrome: No   COGNITION:          Overall cognitive status: Within functional limits for tasks assessed                        SENSATION:          Light touch: Deficits Lt L2-S1 dermatomes associated with "tingling;" no diminished sensation side to side   MUSCLE LENGTH: Hamstrings: WNL BIL Thomas test: Mild limitation BIL   POSTURE:  Slightly diminished lumbar lordosis   PALPATION: TTP BIL Lt>RT lumbar paraspinals/ QL   PASSIVE ACCESSORIES: CPAs hypomobile and painful from L1-L4   LUMBAR AROM   AROM AROM  04/13/2021 AROM 04/25/2021  Flexion 70 tightness 75 minor p!  Extension 20p! 20 minor p!  Right lateral flexion 25p! 35 minor p!  Left lateral flexion 25 minor pain 35 minor p!  Right rotation 50   Left rotation 55    (Blank rows = not tested)   LE MMT:   MMT Right 04/13/2021 Left 04/13/2021  Hip flexion 4+/5 3+/5  Hip extension 4/5 3+/5  Hip abduction 4+/5 4/5  Knee flexion 5/5 4/5  Knee extension 5/5 4/5  Ankle dorsiflexion 5/5 4+/5  Ankle plantar flexion 5/5 4+/5   (Blank rows = not tested)   LUMBAR SPECIAL TESTS:  Slump: (+) on Lt SLR: (+) on Lt ASLR: (-) Cross SLR: (-) Repeated trunk extension: (-)  FUNCTIONAL TESTS:  Plank test: 18 seconds Squat: WNL       TODAY'S TREATMENT  [facility] Adult PT Treatment:                                                DATE: 04/28/2021 Therapeutic Exercise: Knee plank 3x30 seconds Prone cobra stretch 3x20 seconds Side knee plank with clam with RTB around knees 2x10 BIL Standing Cybex hip abduction with 30# cable 2x10 BIL Standing Cybex hip extension with 30# cable 2x10 BIL Standing Cybex hip flexion with 30# cable 2x10 BIL Standing Pallof press with 10# cable 2x10 with 5-sec hold BIL Standing trunk side bend with 23# cable x10 BIL Chops with 7# cable x10 BIL Revere chops with 7# cable x10 BIL Sidelying open book stretch 2x10 BIL Standing windmill stretch 2x10 BIL Quadruped thread the needle thoracic  rotation x 20" LTR 5" hold x 10 BIL    [facility] Adult PT Treatment:                                                DATE: 04/25/2021 Therapeutic Exercise: Knee plank 3x30 seconds Prone cobra stretch 3x20 seconds Standing Cybex hip abduction with 30# cable 2x10 BIL Standing Cybex hip extension with 30# cable 2x10 BIL Standing Pallof press with 7# cable 2x10 with 5-sec hold BIL Standing open book stretch 2x10 BIL Side knee plank with clam with YTB around knees 2x10 BIL Standing trunk side bend with 23# cable 2x10 BIL Standing windmill stretch 2x10 BIL Traditional dead lift with 65# barbell 3x8 Manual Therapy: N/A Neuromuscular re-ed: N/A Therapeutic Activity: N/A Modalities: N/A Self Care: N/A   [facility] Adult PT Treatment:                                                DATE: 04/21/2021 Therapeutic Exercise: Knee plank 3x30 seconds Prone cobra stretch 3x20 seconds Dead bugs 3x16 Hooklying trunk rotation 3x10 BIL Standing Cybex hip abduction with 25# cable 2x10 BIL Standing Cybex hip extension with 25# cable 2x10 BIL Standing Pallof press with 7# cable 2x10 with 5-sec hold BIL Standing open book stretch 2x10 BIL Side knee plank with clam 2x10 BIL Manual Therapy: N/A Neuromuscular re-ed: N/A Therapeutic Activity: N/A Modalities: N/A Self Care: N/A       PATIENT EDUCATION:  Education details: Pt educated on probable pathophysiology behind her pain presentation, prognosis, POC, and HEP Person educated: Patient Education method: Consulting civil engineer, Demonstration, and Handouts Education comprehension: verbalized understanding and returned demonstration     HOME EXERCISE PROGRAM: Access Code: [email or web address] URL: [email or web address] Date: 04/13/2021 Prepared by: Vanessa Rawlins   Exercises Seated Slump Nerve Glide - 1 x daily - 7 x weekly - 3 sets - 20 reps Abdominal Isometric Hold - FEET OFF TABLE* - 1 x daily - 7 x weekly - 3 sets - 30sec hold Prone Press Up - 1  x daily - 7 x weekly - 3 sets - 10 reps Sidelying Hip Abduction - 1 x daily - 7 x weekly - 2 sets - 10 reps - 3-sec hold Supine Bridge - 1 x  daily - 7 x weekly - 3 sets - 10 reps - 3-sec hold     ASSESSMENT:   CLINICAL IMPRESSION: Patient presents to PT with mild lower back pain and subjective overall improvement in pain and function. She demonstrates good participation throughout session with moderate fatigue at end of session. Plan to increase resistance of chops and standing side bends next session. Patient continues to benefit from skilled PT services and should be progressed as able to improve functional independence.     REHAB POTENTIAL: Good   CLINICAL DECISION MAKING: Stable/uncomplicated   EVALUATION COMPLEXITY: Low     GOALS: Goals reviewed with patient? Yes   SHORT TERM GOALS:   STG Name Target Date Goal status  1 Pt will report understanding and adherence to her HEP in order to promote independence in the management of her primary impairments. Baseline: HEP provided at eval 04/21/2021: Pt reports daily adherence to her HEP 05/11/2021 ACHIEVED    LONG TERM GOALS:    LTG Name Target Date Goal status  1 Pt will report ability to sit >30 minutes with 0-2/10 LBP and no LE symptoms in order to perform her work duties without regular standing breaks. Baseline: LE N/T after 10 minutes of sitting 06/08/2021 INITIAL  2 Pt will achieve a 63% FOTO score in order to demonstrate improved functional ability as it relates to her LBP. Baseline: 51% 06/08/2021 INITIAL  3 Pt will achieve WNL global lumbar AROM with 0-1/10 pain in order to play with her children without limitation. Baseline: See AROM chart 04/25/2021: See updated AROM chart 06/08/2021 PROGRESSING  4 Pt will achieve global Lt LE strength of 4+/5 or better in order to return to exercising for personal fitness with less limitation. Baseline: See MMT chart 06/08/2021 INITIAL    PLAN: PT FREQUENCY: 2x/week   PT DURATION: 8  weeks   PLANNED INTERVENTIONS: Therapeutic exercises, Therapeutic activity, Neuro Muscular re-education, Balance training, Patient/Family education, Joint mobilization, Dry Needling, Electrical stimulation, Spinal mobilization, Cryotherapy, Moist heat, Taping, Traction, and Manual therapy   PLAN FOR NEXT SESSION: Progress core/ LE strengthening, introduce manual techniques for pain modulation,      Evelene Croon, PTA 05/02/21 9:56 AM

## 2021-05-04 NOTE — Therapy (Signed)
OUTPATIENT PHYSICAL THERAPY TREATMENT NOTE   Patient Name: Amanda Baird MRN: [id number] DOB:[birth year], 41 y.o., female 37 Date: 05/05/2021  PCP: Percell Belt, DO REFERRING PROVIDER: Percell Belt, DO   PT End of Session - 05/05/21 0914     Visit Number 5    Number of Visits 17    Date for PT Re-Evaluation 06/08/21    Authorization Type Primary Physician Care    PT Start Time 0915    PT Stop Time 1000    PT Time Calculation (min) 45 min    Activity Tolerance Patient tolerated treatment well    Behavior During Therapy [facility] for tasks assessed/performed                History reviewed. No pertinent past medical history. Past Surgical History:  Procedure Laterality Date   ABDOMINAL HYSTERECTOMY N/A 05/26/2015   Procedure:  ABDOMINAL HYSTERECTOMY with bilateral salpingectomy.;  Surgeon: Sherlyn Hay, DO;  Location: [facility];  Service: Gynecology;  Laterality: N/A;   LAPAROSCOPY N/A 05/26/2015   Procedure: LAPAROSCOPY DIAGNOSTIC;  Surgeon: Sherlyn Hay, DO;  Location: [facility];  Service: Gynecology;  Laterality: N/A;   MYOMECTOMY     Patient Active Problem List   Diagnosis Date Noted   S/P TAH (total abdominal hysterectomy) 05/26/2015   Menometrorrhagia 04/23/2015    REFERRING DIAG: M54.42 (ICD-10-CM) - Lumbago with sciatica, left side  THERAPY DIAG:  Chronic left-sided low back pain with left-sided sciatica  Muscle weakness (generalized)   SUBJECTIVE: Pt reports feeling good today, with only moderate tightness in her low back. She reports that she has been doing her HEP daily.  PAIN:  Are you having pain? Yes NPRS scale: 1/10 Pain location: Rt low back PAIN TYPE: Tightness Pain description: intermittent      OBJECTIVE:   *Unless otherwise noted, objective measures collected previously* DIAGNOSTIC FINDINGS:  None related to LBP   PATIENT SURVEYS:  FOTO 51%, projected 63% in 11 visits   SCREENING FOR RED FLAGS: Bowel or bladder  incontinence: No Cauda equina syndrome: No   COGNITION:          Overall cognitive status: Within functional limits for tasks assessed                        SENSATION:          Light touch: Deficits Lt L2-S1 dermatomes associated with "tingling;" no diminished sensation side to side   MUSCLE LENGTH: Hamstrings: WNL BIL Thomas test: Mild limitation BIL   POSTURE:  Slightly diminished lumbar lordosis   PALPATION: TTP BIL Lt>RT lumbar paraspinals/ QL   PASSIVE ACCESSORIES: CPAs hypomobile and painful from L1-L4   LUMBAR AROM   AROM AROM  04/13/2021 AROM 04/25/2021  Flexion 70 tightness 75 minor p!  Extension 20p! 20 minor p!  Right lateral flexion 25p! 35 minor p!  Left lateral flexion 25 minor pain 35 minor p!  Right rotation 50   Left rotation 55    (Blank rows = not tested)   LE MMT:   MMT Right 04/13/2021 Left 04/13/2021 Right 05/05/2021 Left 05/05/2021  Hip flexion 4+/5 3+/5 5/5 5/5  Hip extension 4/5 3+/5 5/5 5/5  Hip abduction 4+/5 4/5 5/5 5/5  Knee flexion 5/5 4/5  5/5  Knee extension 5/5 4/5  5/5  Ankle dorsiflexion 5/5 4+/5  5/5  Ankle plantar flexion 5/5 4+/5  5/5   (Blank rows = not tested)   LUMBAR SPECIAL TESTS:  Slump: (+) on Lt SLR: (+) on Lt ASLR: (-) Cross SLR: (-) Repeated trunk extension: (-)     FUNCTIONAL TESTS:  Plank test: 18 seconds Squat: WNL     05/05/2021: Plank test: 51 seconds   TODAY'S TREATMENT   [facility] Adult PT Treatment:                                                DATE: 05/05/2021 Therapeutic Exercise: Open book stretch x10 with 5-sec hold BIL Side knee plank with hip abduction 2x10 BIL Cross body knee drive with thigh attachment to 7# cable at Free Motion machine 3x15 BIL Heels to heaven 3x15 Prone with hips on Bosu ball, trunk extension with rotation while PT is holding legs down 3x10 Thomas hip flexor stretch on side of table x2 minutes BIL Standing chop with 13# cable and chop bar 2x10 BIL Manual  Therapy: N/A Neuromuscular re-ed: N/A Therapeutic Activity: N/A Modalities: N/A Self Care: N/A   [facility] Adult PT Treatment:                                                DATE: 04/28/2021 Therapeutic Exercise: Knee plank 3x30 seconds Prone cobra stretch 3x20 seconds Side knee plank with clam with RTB around knees 2x10 BIL Standing Cybex hip abduction with 30# cable 2x10 BIL Standing Cybex hip extension with 30# cable 2x10 BIL Standing Cybex hip flexion with 30# cable 2x10 BIL Standing Pallof press with 10# cable 2x10 with 5-sec hold BIL Standing trunk side bend with 23# cable x10 BIL Chops with 7# cable x10 BIL Revere chops with 7# cable x10 BIL Sidelying open book stretch 2x10 BIL Standing windmill stretch 2x10 BIL Quadruped thread the needle thoracic rotation x 20" LTR 5" hold x 10 BIL    [facility] Adult PT Treatment:                                                DATE: 04/25/2021 Therapeutic Exercise: Knee plank 3x30 seconds Prone cobra stretch 3x20 seconds Standing Cybex hip abduction with 30# cable 2x10 BIL Standing Cybex hip extension with 30# cable 2x10 BIL Standing Pallof press with 7# cable 2x10 with 5-sec hold BIL Standing open book stretch 2x10 BIL Side knee plank with clam with YTB around knees 2x10 BIL Standing trunk side bend with 23# cable 2x10 BIL Standing windmill stretch 2x10 BIL Traditional dead lift with 65# barbell 3x8 Manual Therapy: N/A Neuromuscular re-ed: N/A Therapeutic Activity: N/A Modalities: N/A Self Care: N/A         PATIENT EDUCATION:  Education details: Updated HEP Person educated: Patient Education method: Explanation, Demonstration, and Handouts Education comprehension: verbalized understanding and returned demonstration     HOME EXERCISE PROGRAM: Access Code: [email or web address] URL: [email or web address] Date: 04/13/2021 Prepared by: Vanessa [place]   Exercises Seated Slump Nerve Glide - 1 x daily - 7 x weekly -  3 sets - 20 reps Abdominal Isometric Hold - FEET OFF TABLE* - 1 x daily - 7 x weekly - 3 sets - 30sec hold Prone Press Up - 1 x daily - 7  x weekly - 3 sets - 10 reps Sidelying Hip Abduction - 1 x daily - 7 x weekly - 2 sets - 10 reps - 3-sec hold Supine Bridge - 1 x daily - 7 x weekly - 3 sets - 10 reps - 3-sec hold  Added 05/05/2021: Standard Plank - 1 x daily - 7 x weekly - 3 sets Modified Side Plank with Hip Abduction - 1 x daily - 7 x weekly - 3 sets - 10 reps Sidelying Open Book Thoracic Lumbar Rotation and Extension - 1 x daily - 7 x weekly - 2 sets - 10 reps - 5-sec hold     ASSESSMENT:   CLINICAL IMPRESSION: Pt continues to progress well in PT as demonstrated by her excellent improvements in plank time and global LE strength. She responded well to higher level core strengthening exercises today and reports that she can now sit one hour without any symptoms (10 minutes prior). She will continue to benefit from skilled PT to address her primary impairments and return to her prior level of function with less limitation.     REHAB POTENTIAL: Good   CLINICAL DECISION MAKING: Stable/uncomplicated   EVALUATION COMPLEXITY: Low     GOALS: Goals reviewed with patient? Yes   SHORT TERM GOALS:   STG Name Target Date Goal status  1 Pt will report understanding and adherence to her HEP in order to promote independence in the management of her primary impairments. Baseline: HEP provided at eval 04/21/2021: Pt reports daily adherence to her HEP 05/11/2021 ACHIEVED    LONG TERM GOALS:    LTG Name Target Date Goal status  1 Pt will report ability to sit >30 minutes with 0-2/10 LBP and no LE symptoms in order to perform her work duties without regular standing breaks. Baseline: LE N/T after 10 minutes of sitting 05/05/2021: Pt reports being able to sit 1 hour before experiencing any symptoms 06/08/2021 ACHIEVED  2 Pt will achieve a 63% FOTO score in order to demonstrate improved functional  ability as it relates to her LBP. Baseline: 51% 06/08/2021 INITIAL  3 Pt will achieve WNL global lumbar AROM with 0-1/10 pain in order to play with her children without limitation. Baseline: See AROM chart 04/25/2021: See updated AROM chart 06/08/2021 PROGRESSING  4 Pt will achieve global Lt LE strength of 4+/5 or better in order to return to exercising for personal fitness with less limitation. Baseline: See MMT chart 05/05/2021: 5/5 global BIL LE strength 06/08/2021 ACHIEVED    PLAN: PT FREQUENCY: 2x/week   PT DURATION: 8 weeks   PLANNED INTERVENTIONS: Therapeutic exercises, Therapeutic activity, Neuro Muscular re-education, Balance training, Patient/Family education, Joint mobilization, Dry Needling, Electrical stimulation, Spinal mobilization, Cryotherapy, Moist heat, Taping, Traction, and Manual therapy   PLAN FOR NEXT SESSION: Progress core/ LE strengthening, introduce manual techniques for pain modulation,      Vanessa [place], PT, DPT 05/05/21 9:58 AM

## 2021-05-05 ENCOUNTER — Ambulatory Visit: Payer: PRIVATE HEALTH INSURANCE | Attending: Family Medicine

## 2021-05-05 ENCOUNTER — Other Ambulatory Visit: Payer: Self-pay

## 2021-05-05 DIAGNOSIS — M6281 Muscle weakness (generalized): Secondary | ICD-10-CM | POA: Insufficient documentation

## 2021-05-05 DIAGNOSIS — M5442 Lumbago with sciatica, left side: Secondary | ICD-10-CM | POA: Insufficient documentation

## 2021-05-05 DIAGNOSIS — G8929 Other chronic pain: Secondary | ICD-10-CM | POA: Diagnosis present

## 2021-05-09 ENCOUNTER — Ambulatory Visit: Payer: PRIVATE HEALTH INSURANCE

## 2021-05-11 NOTE — Therapy (Signed)
OUTPATIENT PHYSICAL THERAPY TREATMENT NOTE   Patient Name: Amanda Baird MRN: [id number] DOB:13-May-1980, 41 y.o., female Today's Date: 05/12/2021  PCP: Percell Belt, DO REFERRING PROVIDER: Percell Belt, DO   PT End of Session - 05/12/21 0910     Visit Number 6    Number of Visits 17    Date for PT Re-Evaluation 06/08/21    Authorization Type Primary Physician Care    PT Start Time [phone]    PT Stop Time 0955    PT Time Calculation (min) 45 min    Activity Tolerance Patient tolerated treatment well    Behavior During Therapy [facility] for tasks assessed/performed                 History reviewed. No pertinent past medical history. Past Surgical History:  Procedure Laterality Date   ABDOMINAL HYSTERECTOMY N/A 05/26/2015   Procedure:  ABDOMINAL HYSTERECTOMY with bilateral salpingectomy.;  Surgeon: Sherlyn Hay, DO;  Location: [facility];  Service: Gynecology;  Laterality: N/A;   LAPAROSCOPY N/A 05/26/2015   Procedure: LAPAROSCOPY DIAGNOSTIC;  Surgeon: Sherlyn Hay, DO;  Location: [facility];  Service: Gynecology;  Laterality: N/A;   MYOMECTOMY     Patient Active Problem List   Diagnosis Date Noted   S/P TAH (total abdominal hysterectomy) 05/26/2015   Menometrorrhagia 04/23/2015    REFERRING DIAG: M54.42 (ICD-10-CM) - Lumbago with sciatica, left side  THERAPY DIAG:  Chronic left-sided low back pain with left-sided sciatica  Muscle weakness (generalized)   SUBJECTIVE: Pt reports increased stiffness today after being more busy than usual this week. She denies any lifting throughout the week and reports daily adherence to her HEP.  PAIN:  Are you having pain? Yes NPRS scale: 0/10 Pain location: Rt low back PAIN TYPE: Tightness Pain description: intermittent      OBJECTIVE:   *Unless otherwise noted, objective measures collected previously* DIAGNOSTIC FINDINGS:  None related to LBP   PATIENT SURVEYS:  FOTO 51%, projected 63% in 11  visits 05/12/2021: 77%   SCREENING FOR RED FLAGS: Bowel or bladder incontinence: No Cauda equina syndrome: No   COGNITION:          Overall cognitive status: Within functional limits for tasks assessed                        SENSATION:          Light touch: Deficits Lt L2-S1 dermatomes associated with "tingling;" no diminished sensation side to side   MUSCLE LENGTH: Hamstrings: WNL BIL Thomas test: Mild limitation BIL   POSTURE:  Slightly diminished lumbar lordosis   PALPATION: TTP BIL Lt>RT lumbar paraspinals/ QL   PASSIVE ACCESSORIES: CPAs hypomobile and painful from L1-L4   LUMBAR AROM   AROM AROM  04/13/2021 AROM 04/25/2021  Flexion 70 tightness 75 minor p!  Extension 20p! 20 minor p!  Right lateral flexion 25p! 35 minor p!  Left lateral flexion 25 minor pain 35 minor p!  Right rotation 50   Left rotation 55    (Blank rows = not tested)   LE MMT:   MMT Right 04/13/2021 Left 04/13/2021 Right 05/05/2021 Left 05/05/2021  Hip flexion 4+/5 3+/5 5/5 5/5  Hip extension 4/5 3+/5 5/5 5/5  Hip abduction 4+/5 4/5 5/5 5/5  Knee flexion 5/5 4/5  5/5  Knee extension 5/5 4/5  5/5  Ankle dorsiflexion 5/5 4+/5  5/5  Ankle plantar flexion 5/5 4+/5  5/5   (Blank rows = not  tested)   LUMBAR SPECIAL TESTS:  Slump: (+) on Lt SLR: (+) on Lt ASLR: (-) Cross SLR: (-) Repeated trunk extension: (-)     FUNCTIONAL TESTS:  Plank test: 18 seconds Squat: WNL     05/05/2021: Plank test: 51 seconds   TODAY'S TREATMENT   [facility] Adult PT Treatment:                                                DATE: 05/12/2021 Therapeutic Exercise: Sidelying lumbar open books 2x10 BIL Standing chop with 17# cable and chop bar 2x10 BIL Standing knee drive with slow return into hip extension with 7# cable ankle attachment 2x10 BIL Tall-kneeling hip thrusts with knee on Airex pad, 30# cable with waist attachment 3x10 with slow return Prone with hips on Bosu ball and PT holding down legs; trunk extension  2x10 Sidelying with hips on Bosu ball and PT holding down legs; trunk side bend 2x10 BIL Supine heels to heaven 3x10 Manual Therapy: Sidelying Grade V lower lumbar roll manipulation x1 BIL with cavitation and improved lumbar passive accessory mobility Neuromuscular re-ed: N/A Therapeutic Activity: N/A Modalities: N/A Self Care: N/A   [facility] Adult PT Treatment:                                                DATE: 05/05/2021 Therapeutic Exercise: Open book stretch x10 with 5-sec hold BIL Side knee plank with hip abduction 2x10 BIL Cross body knee drive with thigh attachment to 7# cable at Free Motion machine 3x15 BIL Heels to heaven 3x15 Prone with hips on Bosu ball, trunk extension with rotation while PT is holding legs down 3x10 Thomas hip flexor stretch on side of table x2 minutes BIL Standing chop with 13# cable and chop bar 2x10 BIL Manual Therapy: N/A Neuromuscular re-ed: N/A Therapeutic Activity: N/A Modalities: N/A Self Care: N/A   [facility] Adult PT Treatment:                                                DATE: 04/28/2021 Therapeutic Exercise: Knee plank 3x30 seconds Prone cobra stretch 3x20 seconds Side knee plank with clam with RTB around knees 2x10 BIL Standing Cybex hip abduction with 30# cable 2x10 BIL Standing Cybex hip extension with 30# cable 2x10 BIL Standing Cybex hip flexion with 30# cable 2x10 BIL Standing Pallof press with 10# cable 2x10 with 5-sec hold BIL Standing trunk side bend with 23# cable x10 BIL Chops with 7# cable x10 BIL Revere chops with 7# cable x10 BIL Sidelying open book stretch 2x10 BIL Standing windmill stretch 2x10 BIL Quadruped thread the needle thoracic rotation x 20" LTR 5" hold x 10 BIL         PATIENT EDUCATION:  Education details: Updated HEP Person educated: Patient Education method: Explanation, Demonstration, and Handouts Education comprehension: verbalized understanding and returned demonstration     HOME EXERCISE  PROGRAM: Access Code: [email or web address] URL: [email or web address] Date: 04/13/2021 Prepared by: Vanessa [place]   Exercises Seated Slump Nerve Glide - 1 x daily - 7 x weekly - 3 sets - 20 reps  Abdominal Isometric Hold - FEET OFF TABLE* - 1 x daily - 7 x weekly - 3 sets - 30sec hold Prone Press Up - 1 x daily - 7 x weekly - 3 sets - 10 reps Sidelying Hip Abduction - 1 x daily - 7 x weekly - 2 sets - 10 reps - 3-sec hold Supine Bridge - 1 x daily - 7 x weekly - 3 sets - 10 reps - 3-sec hold  Added 05/05/2021: Standard Plank - 1 x daily - 7 x weekly - 3 sets Modified Side Plank with Hip Abduction - 1 x daily - 7 x weekly - 3 sets - 10 reps Sidelying Open Book Thoracic Lumbar Rotation and Extension - 1 x daily - 7 x weekly - 2 sets - 10 reps - 5-sec hold     ASSESSMENT:   CLINICAL IMPRESSION: Pt responded excellently to all interventions today. She reports complete remission of all symptoms following lumbar manipulation, and this was further demonstrated with improved lumbar passive accessory mobility upon re-assessment. Additionally, she demonstrates improved functional ability as indicated by a 26% improvement in her FOTO score. She responded well to progressed core/ hip strengthening today, as well. The pt will continue to benefit from skilled PT to address her primary impairments and return to her prior level of function with less limitation.     REHAB POTENTIAL: Good   CLINICAL DECISION MAKING: Stable/uncomplicated   EVALUATION COMPLEXITY: Low     GOALS: Goals reviewed with patient? Yes   SHORT TERM GOALS:   STG Name Target Date Goal status  1 Pt will report understanding and adherence to her HEP in order to promote independence in the management of her primary impairments. Baseline: HEP provided at eval 04/21/2021: Pt reports daily adherence to her HEP 05/11/2021 ACHIEVED    LONG TERM GOALS:    LTG Name Target Date Goal status  1 Pt will report ability to sit >30  minutes with 0-2/10 LBP and no LE symptoms in order to perform her work duties without regular standing breaks. Baseline: LE N/T after 10 minutes of sitting 05/05/2021: Pt reports being able to sit 1 hour before experiencing any symptoms 06/08/2021 ACHIEVED  2 Pt will achieve a 63% FOTO score in order to demonstrate improved functional ability as it relates to her LBP. Baseline: 51% 05/12/2021: 77% 06/08/2021 ACHIEVED  3 Pt will achieve WNL global lumbar AROM with 0-1/10 pain in order to play with her children without limitation. Baseline: See AROM chart 04/25/2021: See updated AROM chart 06/08/2021 PROGRESSING  4 Pt will achieve global Lt LE strength of 4+/5 or better in order to return to exercising for personal fitness with less limitation. Baseline: See MMT chart 05/05/2021: 5/5 global BIL LE strength 06/08/2021 ACHIEVED    PLAN: PT FREQUENCY: 2x/week   PT DURATION: 8 weeks   PLANNED INTERVENTIONS: Therapeutic exercises, Therapeutic activity, Neuro Muscular re-education, Balance training, Patient/Family education, Joint mobilization, Dry Needling, Electrical stimulation, Spinal mobilization, Cryotherapy, Moist heat, Taping, Traction, and Manual therapy   PLAN FOR NEXT SESSION: Progress core/ LE strengthening, introduce manual techniques for pain modulation,      Vanessa Nikolski, PT, DPT 05/12/21 9:55 AM

## 2021-05-12 ENCOUNTER — Ambulatory Visit: Payer: PRIVATE HEALTH INSURANCE

## 2021-05-12 ENCOUNTER — Other Ambulatory Visit: Payer: Self-pay

## 2021-05-12 DIAGNOSIS — M5442 Lumbago with sciatica, left side: Secondary | ICD-10-CM

## 2021-05-12 DIAGNOSIS — G8929 Other chronic pain: Secondary | ICD-10-CM

## 2021-05-12 DIAGNOSIS — M6281 Muscle weakness (generalized): Secondary | ICD-10-CM

## 2021-05-13 NOTE — Therapy (Incomplete)
OUTPATIENT PHYSICAL THERAPY TREATMENT NOTE   Patient Name: Amanda Baird MRN: [id number] DOB:08-11-1980, 41 y.o., female Today's Date: 05/13/2021  PCP: Percell Belt, DO REFERRING PROVIDER: Rayann Heman, Shawn P, DO         No past medical history on file. Past Surgical History:  Procedure Laterality Date   ABDOMINAL HYSTERECTOMY N/A 05/26/2015   Procedure:  ABDOMINAL HYSTERECTOMY with bilateral salpingectomy.;  Surgeon: Sherlyn Hay, DO;  Location: [facility];  Service: Gynecology;  Laterality: N/A;   LAPAROSCOPY N/A 05/26/2015   Procedure: LAPAROSCOPY DIAGNOSTIC;  Surgeon: Sherlyn Hay, DO;  Location: [facility];  Service: Gynecology;  Laterality: N/A;   MYOMECTOMY     Patient Active Problem List   Diagnosis Date Noted   S/P TAH (total abdominal hysterectomy) 05/26/2015   Menometrorrhagia 04/23/2015    REFERRING DIAG: M54.42 (ICD-10-CM) - Lumbago with sciatica, left side  THERAPY DIAG:  No diagnosis found.   SUBJECTIVE: *** Pt reports increased stiffness today after being more busy than usual this week. She denies any lifting throughout the week and reports daily adherence to her HEP.  PAIN:  Are you having pain? Yes NPRS scale: ***/10 Pain location: Rt low back PAIN TYPE: Tightness Pain description: intermittent      OBJECTIVE:   *Unless otherwise noted, objective measures collected previously* DIAGNOSTIC FINDINGS:  None related to LBP   PATIENT SURVEYS:  FOTO 51%, projected 63% in 11 visits 05/12/2021: 77%   SCREENING FOR RED FLAGS: Bowel or bladder incontinence: No Cauda equina syndrome: No   COGNITION:          Overall cognitive status: Within functional limits for tasks assessed                        SENSATION:          Light touch: Deficits Lt L2-S1 dermatomes associated with "tingling;" no diminished sensation side to side   MUSCLE LENGTH: Hamstrings: WNL BIL Thomas test: Mild limitation BIL   POSTURE:  Slightly diminished  lumbar lordosis   PALPATION: TTP BIL Lt>RT lumbar paraspinals/ QL   PASSIVE ACCESSORIES: CPAs hypomobile and painful from L1-L4   LUMBAR AROM   AROM AROM  04/13/2021 AROM 04/25/2021  Flexion 70 tightness 75 minor p!  Extension 20p! 20 minor p!  Right lateral flexion 25p! 35 minor p!  Left lateral flexion 25 minor pain 35 minor p!  Right rotation 50   Left rotation 55    (Blank rows = not tested)   LE MMT:   MMT Right 04/13/2021 Left 04/13/2021 Right 05/05/2021 Left 05/05/2021  Hip flexion 4+/5 3+/5 5/5 5/5  Hip extension 4/5 3+/5 5/5 5/5  Hip abduction 4+/5 4/5 5/5 5/5  Knee flexion 5/5 4/5  5/5  Knee extension 5/5 4/5  5/5  Ankle dorsiflexion 5/5 4+/5  5/5  Ankle plantar flexion 5/5 4+/5  5/5   (Blank rows = not tested)   LUMBAR SPECIAL TESTS:  Slump: (+) on Lt SLR: (+) on Lt ASLR: (-) Cross SLR: (-) Repeated trunk extension: (-)     FUNCTIONAL TESTS:  Plank test: 18 seconds Squat: WNL     05/05/2021: Plank test: 51 seconds   TODAY'S TREATMENT  [facility] Adult PT Treatment:  DATE: 05/16/2021 Therapeutic Exercise: Sidelying lumbar open books 2x10 BIL Side knee plank with hip abduction Standing chop with 17# cable and chop bar 2x10 BIL Standing knee drive with slow return into hip extension with 7# cable ankle attachment 2x10 BIL Tall-kneeling hip thrusts with knee on Airex pad, 30# cable with waist attachment 3x10 with slow return Prone with hips on Bosu ball and PT holding down legs; trunk extension 2x10 Sidelying with hips on Bosu ball and PT holding down legs; trunk side bend 2x10 BIL Supine heels to heaven 3x10 Thomas hip flexor stretch on side of table x2 minutes BIL   [facility] Adult PT Treatment:                                                DATE: 05/12/2021 Therapeutic Exercise: Sidelying lumbar open books 2x10 BIL Standing chop with 17# cable and chop bar 2x10 BIL Standing knee drive with slow return into hip  extension with 7# cable ankle attachment 2x10 BIL Tall-kneeling hip thrusts with knee on Airex pad, 30# cable with waist attachment 3x10 with slow return Prone with hips on Bosu ball and PT holding down legs; trunk extension 2x10 Sidelying with hips on Bosu ball and PT holding down legs; trunk side bend 2x10 BIL Supine heels to heaven 3x10 Manual Therapy: Sidelying Grade V lower lumbar roll manipulation x1 BIL with cavitation and improved lumbar passive accessory mobility Neuromuscular re-ed: N/A Therapeutic Activity: N/A Modalities: N/A Self Care: N/A   [facility] Adult PT Treatment:                                                DATE: 05/05/2021 Therapeutic Exercise: Open book stretch x10 with 5-sec hold BIL Side knee plank with hip abduction 2x10 BIL Cross body knee drive with thigh attachment to 7# cable at Free Motion machine 3x15 BIL Heels to heaven 3x15 Prone with hips on Bosu ball, trunk extension with rotation while PT is holding legs down 3x10 Thomas hip flexor stretch on side of table x2 minutes BIL Standing chop with 13# cable and chop bar 2x10 BIL Manual Therapy: N/A Neuromuscular re-ed: N/A Therapeutic Activity: N/A Modalities: N/A Self Care: N/A   [facility] Adult PT Treatment:                                                DATE: 04/28/2021 Therapeutic Exercise: Knee plank 3x30 seconds Prone cobra stretch 3x20 seconds Side knee plank with clam with RTB around knees 2x10 BIL Standing Cybex hip abduction with 30# cable 2x10 BIL Standing Cybex hip extension with 30# cable 2x10 BIL Standing Cybex hip flexion with 30# cable 2x10 BIL Standing Pallof press with 10# cable 2x10 with 5-sec hold BIL Standing trunk side bend with 23# cable x10 BIL Chops with 7# cable x10 BIL Revere chops with 7# cable x10 BIL Sidelying open book stretch 2x10 BIL Standing windmill stretch 2x10 BIL Quadruped thread the needle thoracic rotation x 20" LTR 5" hold x 10 BIL        PATIENT  EDUCATION:  Education details: Updated HEP Person  educated: Patient Education method: Explanation, Demonstration, and Handouts Education comprehension: verbalized understanding and returned demonstration     HOME EXERCISE PROGRAM: Access Code: [email or web address] URL: [email or web address] Date: 04/13/2021 Prepared by: Vanessa [place]   Exercises Seated Slump Nerve Glide - 1 x daily - 7 x weekly - 3 sets - 20 reps Abdominal Isometric Hold - FEET OFF TABLE* - 1 x daily - 7 x weekly - 3 sets - 30sec hold Prone Press Up - 1 x daily - 7 x weekly - 3 sets - 10 reps Sidelying Hip Abduction - 1 x daily - 7 x weekly - 2 sets - 10 reps - 3-sec hold Supine Bridge - 1 x daily - 7 x weekly - 3 sets - 10 reps - 3-sec hold  Added 05/05/2021: Standard Plank - 1 x daily - 7 x weekly - 3 sets Modified Side Plank with Hip Abduction - 1 x daily - 7 x weekly - 3 sets - 10 reps Sidelying Open Book Thoracic Lumbar Rotation and Extension - 1 x daily - 7 x weekly - 2 sets - 10 reps - 5-sec hold     ASSESSMENT:   CLINICAL IMPRESSION: ***  Pt responded excellently to all interventions today. She reports complete remission of all symptoms following lumbar manipulation, and this was further demonstrated with improved lumbar passive accessory mobility upon re-assessment. Additionally, she demonstrates improved functional ability as indicated by a 26% improvement in her FOTO score. She responded well to progressed core/ hip strengthening today, as well. The pt will continue to benefit from skilled PT to address her primary impairments and return to her prior level of function with less limitation.     REHAB POTENTIAL: Good   CLINICAL DECISION MAKING: Stable/uncomplicated   EVALUATION COMPLEXITY: Low     GOALS: Goals reviewed with patient? Yes   SHORT TERM GOALS:   STG Name Target Date Goal status  1 Pt will report understanding and adherence to her HEP in order to promote independence in the  management of her primary impairments. Baseline: HEP provided at eval 04/21/2021: Pt reports daily adherence to her HEP 05/11/2021 ACHIEVED    LONG TERM GOALS:    LTG Name Target Date Goal status  1 Pt will report ability to sit >30 minutes with 0-2/10 LBP and no LE symptoms in order to perform her work duties without regular standing breaks. Baseline: LE N/T after 10 minutes of sitting 05/05/2021: Pt reports being able to sit 1 hour before experiencing any symptoms 06/08/2021 ACHIEVED  2 Pt will achieve a 63% FOTO score in order to demonstrate improved functional ability as it relates to her LBP. Baseline: 51% 05/12/2021: 77% 06/08/2021 ACHIEVED  3 Pt will achieve WNL global lumbar AROM with 0-1/10 pain in order to play with her children without limitation. Baseline: See AROM chart 04/25/2021: See updated AROM chart 06/08/2021 PROGRESSING  4 Pt will achieve global Lt LE strength of 4+/5 or better in order to return to exercising for personal fitness with less limitation. Baseline: See MMT chart 05/05/2021: 5/5 global BIL LE strength 06/08/2021 ACHIEVED    PLAN: PT FREQUENCY: 2x/week   PT DURATION: 8 weeks   PLANNED INTERVENTIONS: Therapeutic exercises, Therapeutic activity, Neuro Muscular re-education, Balance training, Patient/Family education, Joint mobilization, Dry Needling, Electrical stimulation, Spinal mobilization, Cryotherapy, Moist heat, Taping, Traction, and Manual therapy   PLAN FOR NEXT SESSION: Progress core/ LE strengthening, introduce manual techniques for pain modulation,    Evelene Croon, PTA 05/13/21 9:53 AM

## 2021-05-16 ENCOUNTER — Ambulatory Visit: Payer: PRIVATE HEALTH INSURANCE

## 2022-02-26 ENCOUNTER — Other Ambulatory Visit: Payer: Self-pay

## 2022-02-26 ENCOUNTER — Emergency Department (HOSPITAL_COMMUNITY)
Admission: EM | Admit: 2022-02-26 | Discharge: 2022-02-27 | Discharge status: Against Medical Advice | Payer: 59 | Attending: Emergency Medicine | Admitting: Emergency Medicine

## 2022-02-26 DIAGNOSIS — R2231 Localized swelling, mass and lump, right upper limb: Secondary | ICD-10-CM | POA: Diagnosis present

## 2022-02-26 DIAGNOSIS — Z5321 Procedure and treatment not carried out due to patient leaving prior to being seen by health care provider: Secondary | ICD-10-CM | POA: Diagnosis not present

## 2022-02-26 NOTE — ED Triage Notes (Signed)
Patient reports lump at right shoulder onset 2 weeks ago , denies drainage , no injury .

## 2022-02-26 NOTE — ED Provider Triage Note (Signed)
Emergency Medicine Provider Triage Evaluation Note  Amanda Baird , a 41 y.o. female  was evaluated in triage.  Pt complains of lump at his right shoulder onset 2 weeks ago.  Denies injury, fever, chills, numbness, headache, neck pain.  Reports that it has grown in size and "feels weird" when it's pressed on.  No drainage.   Review of Systems  Positive: As above Negative: As above  Physical Exam  BP 120/66   Pulse 72   Temp 98.8 F (37.1 C) (Oral)   Resp 18   SpO2 98%  Gen:   Awake, no distress   Resp:  Normal effort  MSK:   Moves extremities without difficulty  Other:  Soft tissue mass on right shoulder above clavicle, no fluctuance, increased warmth  Medical Decision Making  Medically screening exam initiated at 9:34 PM.  Appropriate orders placed.  Amanda Baird was informed that the remainder of the evaluation will be completed by another provider, this initial triage assessment does not replace that evaluation, and the importance of remaining in the ED until their evaluation is complete.     Pat Kocher, [place] 02/26/22 2138

## 2022-02-27 ENCOUNTER — Emergency Department (HOSPITAL_COMMUNITY): Payer: 59

## 2022-02-27 ENCOUNTER — Emergency Department (HOSPITAL_COMMUNITY)
Admission: EM | Admit: 2022-02-27 | Discharge: 2022-02-28 | Discharge status: Against Medical Advice | Payer: 59 | Source: Home / Self Care

## 2022-02-27 ENCOUNTER — Other Ambulatory Visit: Payer: Self-pay

## 2022-02-27 DIAGNOSIS — Z5321 Procedure and treatment not carried out due to patient leaving prior to being seen by health care provider: Secondary | ICD-10-CM | POA: Insufficient documentation

## 2022-02-27 DIAGNOSIS — R2232 Localized swelling, mass and lump, left upper limb: Secondary | ICD-10-CM | POA: Insufficient documentation

## 2022-02-27 NOTE — ED Notes (Signed)
Pt called x2 for vitals recheck with no answer.

## 2022-02-27 NOTE — ED Triage Notes (Signed)
Lump on R shoulder that started 3 weeks ago. No reduced ROM, nontender.  No known injury.

## 2022-02-27 NOTE — ED Provider Triage Note (Signed)
Emergency Medicine Provider Triage Evaluation Note  Amanda Baird , a 41 y.o. female  was evaluated in triage.  Pt complains of right shoulder swelling/mass.  Patient noticed a nonpainful mass developing in the trapezius region around her right shoulder.  There is no fluctuance upon my exam.  Patient was here last night for the same but had to leave due to a babysitter having an emergency.  Review of Systems  Positive: As above Negative: As above  Physical Exam  BP 119/88 (BP Location: Left Arm)   Pulse 70   Temp 98 F (36.7 C) (Oral)   Resp 16   SpO2 96%  Gen:   Awake, no distress   Resp:  Normal effort  MSK:   Moves extremities without difficulty  Other:    Medical Decision Making  Medically screening exam initiated at 9:20 PM.  Appropriate orders placed.  Corrine Tillis was informed that the remainder of the evaluation will be completed by another provider, this initial triage assessment does not replace that evaluation, and the importance of remaining in the ED until their evaluation is complete.     Ronny Bacon 02/27/22 2121

## 2022-02-28 NOTE — ED Notes (Signed)
Pt stated they are leaving

## 2022-05-17 ENCOUNTER — Other Ambulatory Visit: Payer: Self-pay

## 2022-05-17 ENCOUNTER — Emergency Department (HOSPITAL_BASED_OUTPATIENT_CLINIC_OR_DEPARTMENT_OTHER)
Admission: EM | Admit: 2022-05-17 | Discharge: 2022-05-18 | Disposition: A | Discharge status: Home / Self Care | Payer: 59 | Attending: Emergency Medicine | Admitting: Emergency Medicine

## 2022-05-17 ENCOUNTER — Emergency Department (HOSPITAL_BASED_OUTPATIENT_CLINIC_OR_DEPARTMENT_OTHER): Payer: 59 | Admitting: Radiology

## 2022-05-17 ENCOUNTER — Encounter (HOSPITAL_BASED_OUTPATIENT_CLINIC_OR_DEPARTMENT_OTHER): Payer: Self-pay

## 2022-05-17 DIAGNOSIS — D72819 Decreased white blood cell count, unspecified: Secondary | ICD-10-CM | POA: Diagnosis not present

## 2022-05-17 DIAGNOSIS — R000 Tachycardia, unspecified: Secondary | ICD-10-CM | POA: Insufficient documentation

## 2022-05-17 DIAGNOSIS — R059 Cough, unspecified: Secondary | ICD-10-CM | POA: Diagnosis present

## 2022-05-17 DIAGNOSIS — Z20822 Contact with and (suspected) exposure to covid-19: Secondary | ICD-10-CM | POA: Diagnosis not present

## 2022-05-17 DIAGNOSIS — J111 Influenza due to unidentified influenza virus with other respiratory manifestations: Secondary | ICD-10-CM

## 2022-05-17 DIAGNOSIS — J101 Influenza due to other identified influenza virus with other respiratory manifestations: Secondary | ICD-10-CM | POA: Insufficient documentation

## 2022-05-17 LAB — COMPREHENSIVE METABOLIC PANEL
ALT: 20 U/L (ref 0–44)
AST: 22 U/L (ref 15–41)
Albumin: 4.5 g/dL (ref 3.5–5.0)
Alkaline Phosphatase: 53 U/L (ref 38–126)
Anion gap: 10 (ref 5–15)
BUN: 9 mg/dL (ref 6–20)
CO2: 28 mmol/L (ref 22–32)
Calcium: 9.7 mg/dL (ref 8.9–10.3)
Chloride: 101 mmol/L (ref 98–111)
Creatinine, Ser: 1.1 mg/dL — ABNORMAL HIGH (ref 0.44–1.00)
GFR, Estimated: >60 mL/min (ref >60)
Glucose, Bld: 83 mg/dL (ref 70–99)
Potassium: 3.7 mmol/L (ref 3.5–5.1)
Sodium: 139 mmol/L (ref 135–145)
Total Bilirubin: 0.6 mg/dL (ref 0.3–1.2)
Total Protein: 7.6 g/dL (ref 6.5–8.1)

## 2022-05-17 LAB — CBC WITH DIFFERENTIAL/PLATELET
Abs Immature Granulocytes: 0 10*3/uL (ref 0.00–0.07)
Basophils Absolute: 0 10*3/uL (ref 0.0–0.1)
Basophils Relative: 0 %
Eosinophils Absolute: 0 10*3/uL (ref 0.0–0.5)
Eosinophils Relative: 1 %
HCT: 44.4 % (ref 36.0–46.0)
Hemoglobin: 14.5 g/dL (ref 12.0–15.0)
Immature Granulocytes: 0 %
Lymphocytes Relative: 23 %
Lymphs Abs: 0.7 10*3/uL (ref 0.7–4.0)
MCH: 30.9 pg (ref 26.0–34.0)
MCHC: 32.7 g/dL (ref 30.0–36.0)
MCV: 94.7 fL (ref 80.0–100.0)
Monocytes Absolute: 0.8 10*3/uL (ref 0.1–1.0)
Monocytes Relative: 26 %
Neutro Abs: 1.6 10*3/uL — ABNORMAL LOW (ref 1.7–7.7)
Neutrophils Relative %: 50 %
Platelets: 167 10*3/uL (ref 150–400)
RBC: 4.69 MIL/uL (ref 3.87–5.11)
RDW: 12.3 % (ref 11.5–15.5)
WBC: 3.2 10*3/uL — ABNORMAL LOW (ref 4.0–10.5)
nRBC: 0 % (ref 0.0–0.2)

## 2022-05-17 LAB — RESP PANEL BY RT-PCR (RSV, FLU A&B, COVID)  RVPGX2
Influenza A by PCR: NEGATIVE
Influenza B by PCR: POSITIVE — AB
Resp Syncytial Virus by PCR: NEGATIVE
SARS Coronavirus 2 by RT PCR: NEGATIVE

## 2022-05-17 LAB — CK: Total CK: 98 U/L (ref 38–234)

## 2022-05-17 LAB — GROUP A STREP BY PCR: Group A Strep by PCR: NOT DETECTED

## 2022-05-17 LAB — HCG, QUANTITATIVE, PREGNANCY: hCG, Beta Chain, Quant, S: <1 m[IU]/mL (ref <5)

## 2022-05-17 MED ORDER — LACTATED RINGERS IV BOLUS
1000.0000 mL | Freq: Once | INTRAVENOUS | Status: AC
  Administered 2022-05-17: 1000 mL via INTRAVENOUS

## 2022-05-17 MED ORDER — ONDANSETRON HCL 4 MG PO TABS: 4.0000 mg | ORAL_TABLET | Freq: Four times a day (QID) | ORAL | 0 refills | Status: DC | PRN

## 2022-05-17 MED ORDER — OSELTAMIVIR PHOSPHATE 75 MG PO CAPS: 75.0000 mg | ORAL_CAPSULE | Freq: Two times a day (BID) | ORAL | 0 refills | Status: DC

## 2022-05-17 NOTE — Discharge Instructions (Addendum)
You have the flu this is a viral infection that will likely start to improve after 7-10 days, antibiotics are not helpful in treating viral infections.  You may take Tamiflu twice a day for the next 5 days this will help shorten the duration of the flu and prevent complications, this medication can cause some upset stomach, nausea and diarrhea. You may use Zofran as needed for nausea. Please make sure you are drinking plenty of fluids. You can treat your symptoms supportively with tylenol 650 mg/'1000mg'$  and ibuprofen 600 mg every 6 hours for fevers and pains. For nasal congestion you can use Zyrtec and Flonase to help with nasal congestion. To treat cough you can use over the counter cough medications such as Robitussin and throat lozenges. If your symptoms are not improving please follow up with you Primary doctor.   If you develop persistent fevers, shortness of breath or difficulty breathing, chest pain, severe headache and neck pain, persistent nausea and vomiting or other new or concerning symptoms return to the Emergency department.   If you develop persistent fevers, shortness of breath or difficulty breathing, chest pain, severe headache and neck pain, persistent nausea and vomiting or other new or concerning symptoms return to the Emergency department.  Contact a health care provider if: You develop new symptoms. You have: Chest pain. Diarrhea. A fever. Your cough gets worse. You produce more mucus. You feel nauseous or you vomit. Get help right away if you: Develop shortness of breath or have difficulty breathing. Have skin or nails that turn a bluish color. Have severe pain or stiffness in your neck. Develop a sudden headache or sudden pain in your face or ear. Cannot eat or drink without vomiting. These symptoms may represent a serious problem that is an emergency. Do not wait to see if the symptoms will go away. Get medical help right away. Call your local emergency services (911 in  the [place] Do not drive yourself to the hospital.

## 2022-05-17 NOTE — ED Triage Notes (Signed)
Patient here POV from Home.  Endorses Cough, Fatigue, Aches, Fever, Chills, Sore Throat, headache for a few days.   101.9 Temp shortly PTA.  NAD noted during Triage. A&Ox4. GCS 15. Ambulatory.

## 2022-05-17 NOTE — ED Provider Notes (Signed)
[facility] Provider Note   CSN: [id number] Arrival date & time: 05/17/22  1935     History Chief Complaint  Patient presents with   Cough    Amanda Baird is a 42 y.o. female reportedly otherwise healthy presents the emergency room today for evaluation of cough and cold symptoms.  Family members sick with similar symptoms as well.  She reports that she is been having symptoms since yesterday.  Reports fever as high as 102 Fahrenheit.  Frontal headache, body aches, runny nose, nasal congestion, cough, sore throat.  She has tried Mucinex DM prior to arrival.  Denies any chest pain or shortness of breath.  Denies any abdominal pain or neck stiffness.  Denies any hemoptysis or rashes.   Cough Associated symptoms: fever, headaches, myalgias, rhinorrhea and sore throat   Associated symptoms: no chest pain, no chills, no ear pain, no eye discharge, no rash and no shortness of breath        Home Medications Prior to Admission medications   Medication Sig Start Date End Date Taking? Authorizing Provider  ibuprofen (ADVIL,MOTRIN) 800 MG tablet Take 1 tablet (800 mg total) by mouth every 8 (eight) hours as needed for mild pain or moderate pain. 05/27/15   Bovard-Stuckert, Jody, MD  lidocaine (LIDODERM) 5 % Place 1 patch onto the skin daily. Remove & Discard patch within 12 hours or as directed by MD 01/21/21   Truddie Hidden, MD  methocarbamol (ROBAXIN) 500 MG tablet Take 1 tablet (500 mg total) by mouth 2 (two) times daily. 01/21/21   Truddie Hidden, MD  naproxen (NAPROSYN) 500 MG tablet Take 1 tablet (500 mg total) by mouth 2 (two) times daily. 01/21/21   Truddie Hidden, MD  oxyCODONE-acetaminophen (PERCOCET/ROXICET) 5-325 MG tablet Take 1-2 tablets by mouth every 6 (six) hours as needed for moderate pain or severe pain. 05/27/15   Bovard-Stuckert, Jeral Fruit, MD      Allergies    Patient has no known allergies.    Review of Systems    Review of Systems  Constitutional:  Positive for fever. Negative for chills.  HENT:  Positive for congestion, rhinorrhea and sore throat. Negative for drooling, ear pain and trouble swallowing.   Eyes:  Negative for photophobia, discharge and visual disturbance.  Respiratory:  Positive for cough. Negative for shortness of breath.   Cardiovascular:  Negative for chest pain and palpitations.  Gastrointestinal:  Negative for abdominal pain, diarrhea, nausea and vomiting.  Genitourinary:  Negative for dysuria and hematuria.  Musculoskeletal:  Positive for myalgias. Negative for arthralgias, back pain and joint swelling.  Skin:  Negative for color change and rash.  Neurological:  Positive for headaches. Negative for syncope and weakness.    Physical Exam Updated Vital Signs BP 115/78 (BP Location: Right Arm)   Pulse 91   Temp 98.3 F (36.8 C) (Oral)   Resp 18   Ht 5\' 4"  (1.626 m)   Wt 63.5 kg   SpO2 97%   BMI 24.03 kg/m  Physical Exam Vitals and nursing note reviewed.  Constitutional:      General: She is not in acute distress.    Appearance: She is not ill-appearing or toxic-appearing.  HENT:     Head: Normocephalic and atraumatic.     Right Ear: Tympanic membrane, ear canal and external ear normal.     Left Ear: Tympanic membrane, ear canal and external ear normal.     Nose:     Comments:  Bilateral nasal turbinate edema and erythema with scant clear nasal discharge.    Mouth/Throat:     Mouth: Mucous membranes are moist.     Comments: No pharyngeal erythema, exudate, or edema noted.  Uvula midline.  Airway patent.  Moist mucous membranes. Eyes:     General: No scleral icterus.    Extraocular Movements: Extraocular movements intact.     Conjunctiva/sclera: Conjunctivae normal.     Pupils: Pupils are equal, round, and reactive to light.  Cardiovascular:     Rate and Rhythm: Normal rate.  Pulmonary:     Effort: Pulmonary effort is normal. No respiratory distress.      Breath sounds: Normal breath sounds.     Comments: Clear to auscultation bilaterally. Abdominal:     Palpations: Abdomen is soft.     Tenderness: There is no abdominal tenderness. There is no guarding or rebound.  Musculoskeletal:        General: No deformity.     Cervical back: Normal range of motion. No rigidity.  Skin:    General: Skin is warm and dry.  Neurological:     General: No focal deficit present.     Mental Status: She is alert.     Cranial Nerves: No cranial nerve deficit.     Sensory: No sensory deficit.     Motor: No weakness.     ED Results / Procedures / Treatments   Labs (all labs ordered are listed, but only abnormal results are displayed) Labs Reviewed  RESP PANEL BY RT-PCR (RSV, FLU A&B, COVID)  RVPGX2 - Abnormal; Notable for the following components:      Result Value   Influenza B by PCR POSITIVE (*)    All other components within normal limits  CBC WITH DIFFERENTIAL/PLATELET - Abnormal; Notable for the following components:   WBC 3.2 (*)    Neutro Abs 1.6 (*)    All other components within normal limits  COMPREHENSIVE METABOLIC PANEL - Abnormal; Notable for the following components:   Creatinine, Ser 1.10 (*)    All other components within normal limits  GROUP A STREP BY PCR  CK  HCG, QUANTITATIVE, PREGNANCY    EKG None  Radiology DG Chest 2 View  Result Date: 05/17/2022 CLINICAL DATA:  Cough and fatigue EXAM: CHEST - 2 VIEW COMPARISON:  None Available. FINDINGS: The heart size and mediastinal contours are within normal limits. Both lungs are clear. The visualized skeletal structures are unremarkable. IMPRESSION: No active cardiopulmonary disease. Electronically Signed   By: Donavan Foil M.D.   On: 05/17/2022 22:14    Procedures Procedures   Medications Ordered in ED Medications  lactated ringers bolus 1,000 mL (0 mLs Intravenous Stopped 05/17/22 2315)    ED Course/ Medical Decision Making/ A&P                           Medical  Decision Making Amount and/or Complexity of Data Reviewed Labs: ordered. Radiology: ordered.  Risk Prescription drug management.   42 y.o. female presents to the ER for evaluation of cold symptoms. Differential diagnosis includes but is not limited to COVID, flu, RSV, viral illness, bronchitis, pneumonia, sepsis. Vital signs initially tachycardic to the 130s, otherwise unremarkable.Marland Kitchen Physical exam as noted above.   Given the patient's tachycardia on arrival, did order labs and some fluids.  Likely because she just took Mucinex DM.  I independently reviewed and interpreted the patient's labs.  CBC shows slight leukopenia with a  decreased neutrophil count.  Likely due to viral illness.  hCG is negative.  CMP shows creatinine at 1.10 although this appears around her baseline from previous statin.  CK within normal limits.  Negative for strep.  Positive for influenza B.  Chest x-ray shows no acute cardiopulmonary process.  She was ordered a liter of fluid and her tachycardia improved and now her pulse rate is 91.  She also reports that she feels much better after the fluids as well.  Discussed risk and benefits of Tamiflu with the patient.  Given that she just already symptoms yesterday, she is still within the window for treatment.  Patient would like to trial Tamiflu.  Recommended she do this on top of over-the-counter cough and cold medications.  I discouraged the use of Mucinex or Sudafed as these likely work was causing her tachycardia.  I do not think she is septic as her tachycardia has improved she has a mild leukopenia but otherwise is afebrile.  Patient reports she is feeling much better like to go home.  Think this is reasonable.  Given her labs being mainly stable for there were previously other than the slight leukopenia likely from the viral illness, otherwise her vital signs are unremarkable now.  She reports that she is feeling much better.  No signs of pneumonia on chest x-ray.  Her  lung sounds are clear and she is speaking in full sentences satting well on room air without any increased work of breathing.  She reports that she occasionally has some nausea and has not been drinking enough water as well.  Will prescribe her some Zofran to help with this.  She denies any belly pain or vomiting or changes to her bowel habits however.  We discussed the results of the labs/imaging. The plan is to the Tamiflu, Zofran, and over-the-counter cough and cold medication for symptoms. We discussed strict return precautions and red flag symptoms. The patient verbalized their understanding and agrees to the plan. The patient is stable and being discharged home in good condition.  Portions of this report may have been transcribed using voice recognition software. Every effort was made to ensure accuracy; however, inadvertent computerized transcription errors may be present.   Final Clinical Impression(s) / ED Diagnoses Final diagnoses:  Flu    Rx / DC Orders ED Discharge Orders          Ordered    oseltamivir (TAMIFLU) 75 MG capsule  Every 12 hours        05/17/22 2354    ondansetron (ZOFRAN) 4 MG tablet  Every 6 hours PRN        05/17/22 2354              Sherrell Puller, PA-C 05/20/22 1059    Fransico Meadow, MD 05/23/22 [phone]

## 2022-07-15 ENCOUNTER — Emergency Department (HOSPITAL_BASED_OUTPATIENT_CLINIC_OR_DEPARTMENT_OTHER)
Admission: EM | Admit: 2022-07-15 | Discharge: 2022-07-16 | Disposition: A | Discharge status: Home / Self Care | Payer: 59 | Attending: Emergency Medicine | Admitting: Emergency Medicine

## 2022-07-15 ENCOUNTER — Other Ambulatory Visit: Payer: Self-pay

## 2022-07-15 DIAGNOSIS — Y990 Civilian activity done for income or pay: Secondary | ICD-10-CM | POA: Insufficient documentation

## 2022-07-15 DIAGNOSIS — M545 Low back pain, unspecified: Secondary | ICD-10-CM | POA: Diagnosis present

## 2022-07-15 DIAGNOSIS — M5410 Radiculopathy, site unspecified: Secondary | ICD-10-CM

## 2022-07-15 DIAGNOSIS — X500XXA Overexertion from strenuous movement or load, initial encounter: Secondary | ICD-10-CM | POA: Diagnosis not present

## 2022-07-15 NOTE — ED Triage Notes (Signed)
Pt via pov from home with lower back pain and left leg pain x 1 5 days. Pt has hx of the same and has had spinal injections for it. The pain has gotten so bad she is unable to sleep or drive. Pt alert & oriented, nad noted.

## 2022-07-16 ENCOUNTER — Encounter (HOSPITAL_BASED_OUTPATIENT_CLINIC_OR_DEPARTMENT_OTHER): Payer: Self-pay

## 2022-07-16 DIAGNOSIS — M545 Low back pain, unspecified: Secondary | ICD-10-CM | POA: Diagnosis not present

## 2022-07-16 MED ORDER — KETOROLAC TROMETHAMINE 60 MG/2ML IM SOLN
60.0000 mg | Freq: Once | INTRAMUSCULAR | Status: AC
  Administered 2022-07-16: 60 mg via INTRAMUSCULAR
  Filled 2022-07-16: qty 2

## 2022-07-16 MED ORDER — PREDNISONE 10 MG PO TABS: 20.0000 mg | ORAL_TABLET | Freq: Two times a day (BID) | ORAL | 0 refills | Status: DC

## 2022-07-16 MED ORDER — PREDNISONE 20 MG PO TABS
40.0000 mg | ORAL_TABLET | Freq: Once | ORAL | Status: AC
Start: 2022-07-16 — End: 2022-07-16
  Administered 2022-07-16: 40 mg via ORAL
  Filled 2022-07-16: qty 2

## 2022-07-16 MED ORDER — OXYCODONE-ACETAMINOPHEN 5-325 MG PO TABS
2.0000 | ORAL_TABLET | Freq: Once | ORAL | Status: AC
  Administered 2022-07-16: 2 via ORAL
  Filled 2022-07-16: qty 2

## 2022-07-16 MED ORDER — HYDROCODONE-ACETAMINOPHEN 5-325 MG PO TABS: 1.0000 | ORAL_TABLET | Freq: Four times a day (QID) | ORAL | 0 refills | Status: DC | PRN

## 2022-07-16 NOTE — ED Provider Notes (Signed)
[facility] EMERGENCY DEPARTMENT AT [facility] Provider Note   CSN: [id number] Arrival date & time: 07/15/22  2210     History  Chief Complaint  Patient presents with   Back Pain    Amanda Baird is a 42 y.o. female.  Patient is a 42 year old female with history of prior herniated disc.  Patient presenting today with complaints of low back pain.  This started 5 days ago after lifting someone at work.  She describes pain to the low back radiating down her left leg.  She denies any weakness or bowel or bladder complaints.  Pain is worse with movement, bending.  No alleviating factors.  The history is provided by the patient.       Home Medications Prior to Admission medications   Medication Sig Start Date End Date Taking? Authorizing Provider  ibuprofen (ADVIL,MOTRIN) 800 MG tablet Take 1 tablet (800 mg total) by mouth every 8 (eight) hours as needed for mild pain or moderate pain. 05/27/15   Bovard-Stuckert, Jody, MD  lidocaine (LIDODERM) 5 % Place 1 patch onto the skin daily. Remove & Discard patch within 12 hours or as directed by MD 01/21/21   Pollyann Savoy, MD  methocarbamol (ROBAXIN) 500 MG tablet Take 1 tablet (500 mg total) by mouth 2 (two) times daily. 01/21/21   Pollyann Savoy, MD  naproxen (NAPROSYN) 500 MG tablet Take 1 tablet (500 mg total) by mouth 2 (two) times daily. 01/21/21   Pollyann Savoy, MD  ondansetron (ZOFRAN) 4 MG tablet Take 1 tablet (4 mg total) by mouth every 6 (six) hours as needed for nausea or vomiting. 05/17/22   Achille Rich, PA-C  oseltamivir (TAMIFLU) 75 MG capsule Take 1 capsule (75 mg total) by mouth every 12 (twelve) hours. 05/17/22   Achille Rich, PA-C  oxyCODONE-acetaminophen (PERCOCET/ROXICET) 5-325 MG tablet Take 1-2 tablets by mouth every 6 (six) hours as needed for moderate pain or severe pain. 05/27/15   Bovard-Stuckert, Augusto Gamble, MD      Allergies    Patient has no known allergies.    Review of Systems   Review of  Systems  All other systems reviewed and are negative.   Physical Exam Updated Vital Signs BP 121/81 (BP Location: Right Arm)   Pulse 89   Temp 97.8 F (36.6 C) (Oral)   Resp 18   Ht 5\' 4"  (1.626 m)   Wt 65.8 kg   LMP 05/06/2015 (Exact Date)   SpO2 100%   BMI 24.89 kg/m  Physical Exam Vitals and nursing note reviewed.  Constitutional:      Appearance: Normal appearance.  HENT:     Head: Normocephalic and atraumatic.  Pulmonary:     Effort: Pulmonary effort is normal.  Musculoskeletal:     Comments: There is tenderness to palpation in the soft tissues of the left lower lumbar region.  Skin:    General: Skin is warm and dry.  Neurological:     Mental Status: She is alert and oriented to person, place, and time.     Comments: DTRs are 2+ and symmetrical in the patellar and Achilles tendons bilaterally.  Strength is 5 out of 5 in both lower extremities.  She is able to ambulate on heels and toes without difficulty.     ED Results / Procedures / Treatments   Labs (all labs ordered are listed, but only abnormal results are displayed) Labs Reviewed - No data to display  EKG None  Radiology No results found.  Procedures Procedures    Medications Ordered in ED Medications  ketorolac (TORADOL) injection 60 mg (has no administration in time range)  predniSONE (DELTASONE) tablet 40 mg (has no administration in time range)  oxyCODONE-acetaminophen (PERCOCET/ROXICET) 5-325 MG per tablet 2 tablet (has no administration in time range)    ED Course/ Medical Decision Making/ A&P  Patient presenting here with complaints of back pain radiating down her left leg.  She reports a history of a herniated disc for which she received steroid injections.  Patient denies any bowel or bladder complaints and no weakness.  She arrives here with stable vital signs, but does appear uncomfortable.  Physical examination reveals symmetrical and normal reflexes and strength.  She has no red  flags that would suggest an emergent situation and I feel can safely be discharged.  She has received Toradol, prednisone, and Percocet here in the ER.  She will be discharged with prednisone and hydrocodone.  To follow-up with primary doctor if not improving.  Final Clinical Impression(s) / ED Diagnoses Final diagnoses:  None    Rx / DC Orders ED Discharge Orders     None         Geoffery Lyons, MD 07/16/22 0041

## 2022-07-16 NOTE — Discharge Instructions (Signed)
Begin taking prednisone as prescribed.  Begin taking hydrocodone as prescribed as needed for pain.  Follow-up with your primary doctor if symptoms are not improving in the next week. 

## 2022-08-22 ENCOUNTER — Encounter: Payer: Self-pay | Admitting: Adult Health

## 2022-08-22 ENCOUNTER — Ambulatory Visit (INDEPENDENT_AMBULATORY_CARE_PROVIDER_SITE_OTHER): Payer: 59 | Admitting: Adult Health

## 2022-08-22 VITALS — BP 114/70 | HR 72 | Ht 64.0 in | Wt 142.0 lb

## 2022-08-22 DIAGNOSIS — F902 Attention-deficit hyperactivity disorder, combined type: Secondary | ICD-10-CM | POA: Diagnosis not present

## 2022-08-22 MED ORDER — AMPHETAMINE-DEXTROAMPHETAMINE 20 MG PO TABS
20.0000 mg | ORAL_TABLET | Freq: Two times a day (BID) | ORAL | 0 refills | Status: DC
Start: 2022-08-22 — End: 2022-09-19

## 2022-08-22 MED ORDER — HYDROXYZINE HCL 25 MG PO TABS
ORAL_TABLET | ORAL | 2 refills | Status: DC
Start: 2022-08-22 — End: 2022-09-19

## 2022-08-22 NOTE — Progress Notes (Signed)
[facility] MD/PA/NP Initial Note  08/22/2022 2:02 PM Azariah Robarge  MRN:  [id number]  Chief Complaint:   HPI:   Patient seen today for initial psychiatric evaluation.   Describes mood today as "ok". Pleasant. Denies tearfulness. Mood symptoms - denies depression. Feels irritable at times. Reports increased anxiety.Reports worry, rumination and over thinking - very analytical. Denies panic attacks. Denies mood swings. Reports academic struggles throughout her life. Reports struggles to finish her Bachelor's degree. Now enrolled in a Master's program and is struggling. Reports inability to comprehend because she can't pay attention. Reports stopping and starting different projects. Reports concerns of possible attention deficit disorder. Stating "something has to give". Willing to consider treatment options. Stable interest and motivation. Taking medications as prescribed.  Energy levels stable. Active, does not have a regular exercise routine - herniated disc.  Enjoys some usual interests and activities. Married. Has 5 children between. Spending time with family. Appetite adequate. Weight stable - 142 pounds. Reports difficulties with sleep. Reports mind racing at not. Averages 3 to 4 hours. Using Unisom some nights Focus and concentration stable. Completing tasks. Managing aspects of household. 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 Enrolled in school Denies SI or HI.  Denies AH or VH. Denies self harm. Denies substance use. Denies alcohol use.   Previous medication trials:  Zoloft, Cymbalta  Visit Diagnosis:    ICD-10-CM   1. Attention deficit hyperactivity disorder (ADHD), combined type  F90.2 amphetamine-dextroamphetamine (ADDERALL) 20 MG tablet    hydrOXYzine (ATARAX) 25 MG tablet      Past Psychiatric History: Denies psychiatric hospitalization.   Past Medical History: No past medical history on file.  Past Surgical History:  Procedure Laterality Date    ABDOMINAL HYSTERECTOMY N/A 05/26/2015   Procedure:  ABDOMINAL HYSTERECTOMY with bilateral salpingectomy.;  Surgeon: Edwinna Areola, DO;  Location: [facility];  Service: Gynecology;  Laterality: N/A;   LAPAROSCOPY N/A 05/26/2015   Procedure: LAPAROSCOPY DIAGNOSTIC;  Surgeon: Edwinna Areola, DO;  Location: [facility];  Service: Gynecology;  Laterality: N/A;   MYOMECTOMY      Family Psychiatric History: Denies any family history of mental illness.   Family History: No family history on file.  Social History:  Social History   Socioeconomic History   Marital status: Married    Spouse name: Not on file   Number of children: Not on file   Years of education: Not on file   Highest education level: Not on file  Occupational History   Not on file  Tobacco Use   Smoking status: Never   Smokeless tobacco: Never  Vaping Use   Vaping Use: Never used  Substance and Sexual Activity   Alcohol use: Not Currently    Comment: occ.   Drug use: No   Sexual activity: Not on file  Other Topics Concern   Not on file  Social History Narrative   Not on file   Social Determinants of Health   Financial Resource Strain: Not on file  Food Insecurity: Not on file  Transportation Needs: Not on file  Physical Activity: Not on file  Stress: Not on file  Social Connections: Not on file    Allergies:  Allergies  Allergen Reactions   Duloxetine Other (See Comments)    No appetite.    Metabolic Disorder Labs: No results found for: "HGBA1C", "MPG" No results found for: "PROLACTIN" No results found for: "CHOL", "TRIG", "HDL", "CHOLHDL", "VLDL", "LDLCALC" No results found for: "TSH"  Therapeutic Level  Labs: No results found for: "LITHIUM" No results found for: "VALPROATE" No results found for: "CBMZ"  Current Medications: Current Outpatient Medications  Medication Sig Dispense Refill   amphetamine-dextroamphetamine (ADDERALL) 20 MG tablet Take 1 tablet (20 mg total) by mouth 2 (two)  times daily. 60 tablet 0   hydrOXYzine (ATARAX) 25 MG tablet Take one to two tablets at bedtime as needed for sleep. 60 tablet 2   No current facility-administered medications for this visit.    Medication Side Effects: none  Orders placed this visit:  No orders of the defined types were placed in this encounter.   Psychiatric Specialty Exam:  Review of Systems  Musculoskeletal:  Negative for gait problem.  Neurological:  Negative for tremors.  Psychiatric/Behavioral:         Please refer to HPI    Blood pressure 114/70, pulse 72, height 5\' 4"  (1.626 m), weight 142 lb (64.4 kg), last menstrual period 05/06/2015.Body mass index is 24.37 kg/m.  General Appearance: Casual and Neat  Eye Contact:  Good  Speech:  Clear and Coherent and Normal Rate  Volume:  Normal  Mood:  Euthymic  Affect:  Appropriate and Congruent  Thought Process:  Coherent and Descriptions of Associations: Intact  Orientation:  Full (Time, Place, and Person)  Thought Content: Logical   Suicidal Thoughts:  No  Homicidal Thoughts:  No  Memory:  WNL  Judgement:  Good  Insight:  NA and Good  Psychomotor Activity:  Normal  Concentration:  Concentration: Difficulties and Attention Span: Difficulties.  Recall:  Good  Fund of Knowledge: Good  Language: Good  Assets:  Communication Skills Desire for Improvement Financial Resources/Insurance Housing Intimacy Leisure Time Physical Health Resilience Social Support Talents/Skills Transportation Vocational/Educational  ADL's:  Intact  Cognition: WNL  Prognosis:  Good   Screenings:  Flowsheet Row ED from 07/15/2022 in [facility] Emergency Department at [facility] ED from 05/17/2022 in [facility] ED from 02/27/2022 in [facility] Emergency Department at [facility]  C-SSRS RISK CATEGORY No Risk No Risk No Risk       Receiving Psychotherapy: No   Treatment Plan/Recommendations:    Plan:  PDMP  reviewed  Add Adderall 20mg  twice daily - discussed taper Add Hydroxyzine 25mg  BID  Consider SSRI   Discussed NAC tabs - Magnesium L-Threonate  [facility] 45/58 ADD likely  RTC 4 weeks  Patient advised to contact office with any questions, adverse effects, or acute worsening in signs and symptoms.  Discussed potential benefits, risks, and side effects of stimulants with patient to include increased heart rate, palpitations, insomnia, increased anxiety, increased irritability, or decreased appetite.  Instructed patient to contact office if experiencing any significant tolerability issues.   Dorothyann Gibbs, NP

## 2022-09-19 ENCOUNTER — Ambulatory Visit (INDEPENDENT_AMBULATORY_CARE_PROVIDER_SITE_OTHER): Payer: 59 | Admitting: Adult Health

## 2022-09-19 ENCOUNTER — Encounter: Payer: Self-pay | Admitting: Adult Health

## 2022-09-19 DIAGNOSIS — F902 Attention-deficit hyperactivity disorder, combined type: Secondary | ICD-10-CM | POA: Diagnosis not present

## 2022-09-19 MED ORDER — HYDROXYZINE HCL 25 MG PO TABS
ORAL_TABLET | ORAL | 2 refills | Status: DC
Start: 2022-09-19 — End: 2022-11-20

## 2022-09-19 MED ORDER — AMPHETAMINE-DEXTROAMPHETAMINE 20 MG PO TABS: 20.0000 mg | ORAL_TABLET | Freq: Every day | ORAL | 0 refills | Status: DC

## 2022-09-19 MED ORDER — AMPHETAMINE-DEXTROAMPHETAMINE 20 MG PO TABS: 20.0000 mg | ORAL_TABLET | Freq: Two times a day (BID) | ORAL | 0 refills | Status: DC

## 2022-09-19 NOTE — Progress Notes (Signed)
Amanda Baird [id number] Jul 27, 1980 42 y.o.  Subjective:   Patient ID:  Amanda Baird is a 42 y.o. (DOB Jan 23, 1981) female.  Chief Complaint: No chief complaint on file.   HPI Amanda Baird presents to the office today for follow-up of ADD and insomnia.  Describes mood today as "ok". Pleasant. Denies tearfulness. Mood symptoms - denies depression and irritability. Reports decreased anxiety. Reports deceased worry, rumination and over thinking. Denies panic attacks. Denies mood swings. Reports doing better in the academic setting since starting Adderall. Has been able to complete school work and get more done around the house. Enrolled in a Master's program. Stable interest and motivation. Taking medications as prescribed.  Energy levels stable. Active, does not have a regular exercise routine - herniated disc.  Enjoys some usual interests and activities. Married. Has 5 children between. Spending time with family. Appetite adequate. Weight stable - 142 pounds. Reports sleep has improved. Averages 6 to 6.5 hours.  Focus and concentration improved. Completing tasks. Managing aspects of household. 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 Enrolled in school - doing better. Denies SI or HI.  Denies AH or VH. Denies self harm. Denies substance use. Denies alcohol use.   Previous medication trials:  Zoloft, Cymbalta   Flowsheet Row ED from 07/15/2022 in [facility] Emergency Department at [facility] ED from 05/17/2022 in [facility] Emergency Department at [facility] ED from 02/27/2022 in [facility] Emergency Department at [facility]  C-SSRS RISK CATEGORY No Risk No Risk No Risk        Review of Systems:  Review of Systems  Musculoskeletal:  Negative for gait problem.  Neurological:  Negative for tremors.  Psychiatric/Behavioral:         Please refer to HPI    Medications: I have reviewed the patient's current  medications.  Current Outpatient Medications  Medication Sig Dispense Refill   amphetamine-dextroamphetamine (ADDERALL) 20 MG tablet Take 1 tablet (20 mg total) by mouth 2 (two) times daily. 60 tablet 0   hydrOXYzine (ATARAX) 25 MG tablet Take one to two tablets at bedtime as needed for sleep. 60 tablet 2   No current facility-administered medications for this visit.    Medication Side Effects: None  Allergies:  Allergies  Allergen Reactions   Duloxetine Other (See Comments)    No appetite.    No past medical history on file.  Past Medical History, Surgical history, Social history, and Family history were reviewed and updated as appropriate.   Please see review of systems for further details on the patient's review from today.   Objective:   Physical Exam:  LMP 05/06/2015 (Exact Date)   Physical Exam Constitutional:      General: She is not in acute distress. Musculoskeletal:        General: No deformity.  Neurological:     Mental Status: She is alert and oriented to person, place, and time.     Coordination: Coordination normal.  Psychiatric:        Attention and Perception: Attention and perception normal. She does not perceive auditory or visual hallucinations.        Mood and Affect: Mood normal. Mood is not anxious or depressed. Affect is not labile, blunt, angry or inappropriate.        Speech: Speech normal.        Behavior: Behavior normal.        Thought Content: Thought content normal. Thought content is not paranoid or delusional.  Thought content does not include homicidal or suicidal ideation. Thought content does not include homicidal or suicidal plan.        Cognition and Memory: Cognition and memory normal.        Judgment: Judgment normal.     Comments: Insight intact     Lab Review:     Component Value Date/Time   NA 139 05/17/2022 2225   K 3.7 05/17/2022 2225   CL 101 05/17/2022 2225   CO2 28 05/17/2022 2225   GLUCOSE 83 05/17/2022 2225    BUN 9 05/17/2022 2225   CREATININE 1.10 (H) 05/17/2022 2225   CALCIUM 9.7 05/17/2022 2225   PROT 7.6 05/17/2022 2225   ALBUMIN 4.5 05/17/2022 2225   AST 22 05/17/2022 2225   ALT 20 05/17/2022 2225   ALKPHOS 53 05/17/2022 2225   BILITOT 0.6 05/17/2022 2225   GFRNONAA >60 05/17/2022 2225   GFRAA >60 10/25/2016 2300       Component Value Date/Time   WBC 3.2 (L) 05/17/2022 2225   RBC 4.69 05/17/2022 2225   HGB 14.5 05/17/2022 2225   HCT 44.4 05/17/2022 2225   PLT 167 05/17/2022 2225   MCV 94.7 05/17/2022 2225   MCH 30.9 05/17/2022 2225   MCHC 32.7 05/17/2022 2225   RDW 12.3 05/17/2022 2225   LYMPHSABS 0.7 05/17/2022 2225   MONOABS 0.8 05/17/2022 2225   EOSABS 0.0 05/17/2022 2225   BASOSABS 0.0 05/17/2022 2225    No results found for: "POCLITH", "LITHIUM"   No results found for: "PHENYTOIN", "PHENOBARB", "VALPROATE", "CBMZ"   .res Assessment: Plan:     Plan:  PDMP reviewed  Adderall 20mg  twice daily  Hydroxyzine 25mg  BID  BP 111/74/73  Consider SSRI   Discussed NAC tabs - Magnesium L-Threonate  Psych Central 45/58 ADD likely  RTC 8 weeks  Patient advised to contact office with any questions, adverse effects, or acute worsening in signs and symptoms.  Discussed potential benefits, risks, and side effects of stimulants with patient to include increased heart rate, palpitations, insomnia, increased anxiety, increased irritability, or decreased appetite.  Instructed patient to contact office if experiencing any significant tolerability issues.   There are no diagnoses linked to this encounter.   Please see After Visit Summary for patient specific instructions.  Future Appointments  Date Time Provider Department Center  09/19/2022  8:40 AM Delshon Blanchfield, Thereasa Solo, NP CP-CP None    No orders of the defined types were placed in this encounter.   -------------------------------

## 2022-11-20 ENCOUNTER — Ambulatory Visit (INDEPENDENT_AMBULATORY_CARE_PROVIDER_SITE_OTHER): Payer: 59 | Admitting: Adult Health

## 2022-11-20 ENCOUNTER — Encounter: Payer: Self-pay | Admitting: Adult Health

## 2022-11-20 DIAGNOSIS — F902 Attention-deficit hyperactivity disorder, combined type: Secondary | ICD-10-CM

## 2022-11-20 MED ORDER — AMPHETAMINE-DEXTROAMPHETAMINE 20 MG PO TABS
20.0000 mg | ORAL_TABLET | Freq: Two times a day (BID) | ORAL | 0 refills | Status: DC
Start: 2022-12-18 — End: 2023-03-04

## 2022-11-20 MED ORDER — HYDROXYZINE HCL 25 MG PO TABS
ORAL_TABLET | ORAL | 5 refills | Status: DC
Start: 2022-11-20 — End: 2023-03-04

## 2022-11-20 MED ORDER — AMPHETAMINE-DEXTROAMPHETAMINE 20 MG PO TABS: 20.0000 mg | ORAL_TABLET | Freq: Two times a day (BID) | ORAL | 0 refills | Status: DC

## 2022-11-20 MED ORDER — AMPHETAMINE-DEXTROAMPHETAMINE 20 MG PO TABS
20.0000 mg | ORAL_TABLET | Freq: Two times a day (BID) | ORAL | 0 refills | Status: DC
Start: 2023-01-15 — End: 2023-03-04

## 2022-11-20 NOTE — Progress Notes (Signed)
Amanda Baird [id number] August 13, 1980 42 y.o.  Subjective:   Patient ID:  Amanda Baird is a 42 y.o. (DOB [birth year]) female.  Chief Complaint: No chief complaint on file.   HPI Hanny Coltrin presents to the office today for follow-up of ADD and insomnia.  Describes mood today as "ok". Pleasant. Reports some tearfulness. Mood symptoms - denies depression, anxiety and irritability. Denies panic attacks. Reports worry, rumination and over thinking. Mood is stable. Feels like current medication regimen works well. Reports mother passed away a week ago unexpectedly. Enrolled in a Master's program. Stable interest and motivation. Taking medications as prescribed.  Energy levels stable. Active, does not have a regular exercise routine - herniated disc.  Enjoys some usual interests and activities. Married. Has 5 children between. Spending time with family. Appetite adequate. Weight stable - 142 pounds. Reports sleep has improved. Averages 6 to 6.5 hours.  Focus and concentration improved. Completing tasks. Managing aspects of household. 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 Denies SI or HI.  Denies AH or VH. Denies self harm. Denies substance use. Denies alcohol use.   Previous medication trials:  Zoloft, Cymbalta   Flowsheet Row ED from 07/15/2022 in [facility] Emergency Department at [facility] ED from 05/17/2022 in [facility] Emergency Department at [facility] ED from 02/27/2022 in [facility] Emergency Department at [facility]  C-SSRS RISK CATEGORY No Risk No Risk No Risk        Review of Systems:  Review of Systems  Musculoskeletal:  Negative for gait problem.  Neurological:  Negative for tremors.  Psychiatric/Behavioral:         Please refer to HPI    Medications: I have reviewed the patient's current medications.  Current Outpatient Medications  Medication Sig Dispense Refill   [START ON 12/18/2022]  amphetamine-dextroamphetamine (ADDERALL) 20 MG tablet Take 1 tablet (20 mg total) by mouth 2 (two) times daily. 60 tablet 0   [START ON 01/15/2023] amphetamine-dextroamphetamine (ADDERALL) 20 MG tablet Take 1 tablet (20 mg total) by mouth 2 (two) times daily. 60 tablet 0   amphetamine-dextroamphetamine (ADDERALL) 20 MG tablet Take 1 tablet (20 mg total) by mouth 2 (two) times daily. 60 tablet 0   hydrOXYzine (ATARAX) 25 MG tablet Take one to two tablets at bedtime as needed for sleep. 60 tablet 5   No current facility-administered medications for this visit.    Medication Side Effects: None  Allergies:  Allergies  Allergen Reactions   Duloxetine Other (See Comments)    No appetite.    No past medical history on file.  Past Medical History, Surgical history, Social history, and Family history were reviewed and updated as appropriate.   Please see review of systems for further details on the patient's review from today.   Objective:   Physical Exam:  LMP 05/06/2015 (Exact Date)   Physical Exam Constitutional:      General: She is not in acute distress. Musculoskeletal:        General: No deformity.  Neurological:     Mental Status: She is alert and oriented to person, place, and time.     Coordination: Coordination normal.  Psychiatric:        Attention and Perception: Attention and perception normal. She does not perceive auditory or visual hallucinations.        Mood and Affect: Affect is not labile, blunt, angry or inappropriate.        Speech: Speech normal.  Behavior: Behavior normal.        Thought Content: Thought content normal. Thought content is not paranoid or delusional. Thought content does not include homicidal or suicidal ideation. Thought content does not include homicidal or suicidal plan.        Cognition and Memory: Cognition and memory normal.        Judgment: Judgment normal.     Comments: Insight intact     Lab Review:     Component Value  Date/Time   NA 139 05/17/2022 2225   K 3.7 05/17/2022 2225   CL 101 05/17/2022 2225   CO2 28 05/17/2022 2225   GLUCOSE 83 05/17/2022 2225   BUN 9 05/17/2022 2225   CREATININE 1.10 (H) 05/17/2022 2225   CALCIUM 9.7 05/17/2022 2225   PROT 7.6 05/17/2022 2225   ALBUMIN 4.5 05/17/2022 2225   AST 22 05/17/2022 2225   ALT 20 05/17/2022 2225   ALKPHOS 53 05/17/2022 2225   BILITOT 0.6 05/17/2022 2225   GFRNONAA >60 05/17/2022 2225   GFRAA >60 10/25/2016 2300       Component Value Date/Time   WBC 3.2 (L) 05/17/2022 2225   RBC 4.69 05/17/2022 2225   HGB 14.5 05/17/2022 2225   HCT 44.4 05/17/2022 2225   PLT 167 05/17/2022 2225   MCV 94.7 05/17/2022 2225   MCH 30.9 05/17/2022 2225   MCHC 32.7 05/17/2022 2225   RDW 12.3 05/17/2022 2225   LYMPHSABS 0.7 05/17/2022 2225   MONOABS 0.8 05/17/2022 2225   EOSABS 0.0 05/17/2022 2225   BASOSABS 0.0 05/17/2022 2225    No results found for: "POCLITH", "LITHIUM"   No results found for: "PHENYTOIN", "PHENOBARB", "VALPROATE", "CBMZ"   .res Assessment: Plan:     Plan:  PDMP reviewed  Adderall 20mg  twice daily  Hydroxyzine 25mg  BID  BP 118/74/82  Consider SSRI   Discussed NAC tabs - Magnesium L-Threonate  Psych Central 45/58 ADD likely  RTC 3 months  Patient advised to contact office with any questions, adverse effects, or acute worsening in signs and symptoms.  Discussed potential benefits, risks, and side effects of stimulants with patient to include increased heart rate, palpitations, insomnia, increased anxiety, increased irritability, or decreased appetite.  Instructed patient to contact office if experiencing any significant tolerability issues.  Diagnoses and all orders for this visit:  Attention deficit hyperactivity disorder (ADHD), combined type -     amphetamine-dextroamphetamine (ADDERALL) 20 MG tablet; Take 1 tablet (20 mg total) by mouth 2 (two) times daily. -     amphetamine-dextroamphetamine (ADDERALL) 20 MG  tablet; Take 1 tablet (20 mg total) by mouth 2 (two) times daily. -     amphetamine-dextroamphetamine (ADDERALL) 20 MG tablet; Take 1 tablet (20 mg total) by mouth 2 (two) times daily. -     hydrOXYzine (ATARAX) 25 MG tablet; Take one to two tablets at bedtime as needed for sleep.     Please see After Visit Summary for patient specific instructions.  Future Appointments  Date Time Provider Department Center  02/19/2023  8:20 AM Gurtej Noyola, Thereasa Solo, NP CP-CP None    No orders of the defined types were placed in this encounter.   -------------------------------

## 2023-02-19 ENCOUNTER — Ambulatory Visit: Payer: 59 | Admitting: Adult Health

## 2023-03-04 ENCOUNTER — Ambulatory Visit (INDEPENDENT_AMBULATORY_CARE_PROVIDER_SITE_OTHER): Payer: 59 | Admitting: Adult Health

## 2023-03-04 ENCOUNTER — Encounter: Payer: Self-pay | Admitting: Adult Health

## 2023-03-04 DIAGNOSIS — F902 Attention-deficit hyperactivity disorder, combined type: Secondary | ICD-10-CM

## 2023-03-04 MED ORDER — AMPHETAMINE-DEXTROAMPHETAMINE 20 MG PO TABS
20.0000 mg | ORAL_TABLET | Freq: Two times a day (BID) | ORAL | 0 refills | Status: DC
Start: 2023-04-01 — End: 2023-05-31

## 2023-03-04 MED ORDER — HYDROXYZINE HCL 25 MG PO TABS: ORAL_TABLET | ORAL | 5 refills | Status: DC

## 2023-03-04 MED ORDER — AMPHETAMINE-DEXTROAMPHETAMINE 20 MG PO TABS
20.0000 mg | ORAL_TABLET | Freq: Two times a day (BID) | ORAL | 0 refills | Status: DC
Start: 2023-03-04 — End: 2023-05-31

## 2023-03-04 MED ORDER — AMPHETAMINE-DEXTROAMPHETAMINE 20 MG PO TABS: 20.0000 mg | ORAL_TABLET | Freq: Two times a day (BID) | ORAL | 0 refills | Status: DC

## 2023-03-04 NOTE — Progress Notes (Signed)
Amanda Baird [id number] 18-Jul-1980 42 y.o.  Subjective:   Patient ID:  Amanda Baird is a 42 y.o. (DOB [birth year]) female.  Chief Complaint: No chief complaint on file.   HPI Maryruth Amiel presents to the office today for follow-up of ADD and insomnia.  Describes mood today as "ok". Pleasant. Reports some tearfulness. Mood symptoms - reports some situational depression and anxiety. Denies irritability. Denies panic attacks. Reports worry, rumination and over thinking. Reports grieving loss of mother in September. Mood is stable - "for the most part". Feels like current medication regimen works well. Stable interest and motivation. Taking medications as prescribed.  Energy levels stable. Active, does not have a regular exercise routine - herniated disc.  Enjoys some usual interests and activities. Married. Has 5 children between. Spending time with family. Appetite adequate. Weight stable - 130 pounds. Reports sleep has improved. Averages 7 hours.  Focus and concentration improved. Feels like the Adderall is helpful. Completing tasks. Managing aspects of household. 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 Denies SI or HI.  Denies AH or VH. Denies self harm. Denies substance use. Denies alcohol use.   Previous medication trials:  Zoloft, Cymbalta   Flowsheet Row ED from 07/15/2022 in [facility] Emergency Department at [facility] ED from 05/17/2022 in [facility] Emergency Department at [facility] ED from 02/27/2022 in [facility] Emergency Department at [facility]  C-SSRS RISK CATEGORY No Risk No Risk No Risk        Review of Systems:  Review of Systems  Musculoskeletal:  Negative for gait problem.  Neurological:  Negative for tremors.  Psychiatric/Behavioral:         Please refer to HPI    Medications: I have reviewed the patient's current medications.  Current  Outpatient Medications  Medication Sig Dispense Refill   amphetamine-dextroamphetamine (ADDERALL) 20 MG tablet Take 1 tablet (20 mg total) by mouth 2 (two) times daily. 60 tablet 0   amphetamine-dextroamphetamine (ADDERALL) 20 MG tablet Take 1 tablet (20 mg total) by mouth 2 (two) times daily. 60 tablet 0   amphetamine-dextroamphetamine (ADDERALL) 20 MG tablet Take 1 tablet (20 mg total) by mouth 2 (two) times daily. 60 tablet 0   hydrOXYzine (ATARAX) 25 MG tablet Take one to two tablets at bedtime as needed for sleep. 60 tablet 5   No current facility-administered medications for this visit.    Medication Side Effects: None  Allergies:  Allergies  Allergen Reactions   Duloxetine Other (See Comments)    No appetite.    No past medical history on file.  Past Medical History, Surgical history, Social history, and Family history were reviewed and updated as appropriate.   Please see review of systems for further details on the patient's review from today.   Objective:   Physical Exam:  LMP 05/06/2015 (Exact Date)   Physical Exam Constitutional:      General: She is not in acute distress. Musculoskeletal:        General: No deformity.  Neurological:     Mental Status: She is alert and oriented to person, place, and time.     Coordination: Coordination normal.  Psychiatric:        Attention and Perception: Attention and perception normal. She does not perceive auditory or visual hallucinations.        Mood and Affect: Affect is not labile, blunt, angry or inappropriate.  Speech: Speech normal.        Behavior: Behavior normal.        Thought Content: Thought content normal. Thought content is not paranoid or delusional. Thought content does not include homicidal or suicidal ideation. Thought content does not include homicidal or suicidal plan.        Cognition and Memory: Cognition and memory normal.        Judgment: Judgment normal.     Comments: Insight intact      Lab Review:     Component Value Date/Time   NA 139 05/17/2022 2225   K 3.7 05/17/2022 2225   CL 101 05/17/2022 2225   CO2 28 05/17/2022 2225   GLUCOSE 83 05/17/2022 2225   BUN 9 05/17/2022 2225   CREATININE 1.10 (H) 05/17/2022 2225   CALCIUM 9.7 05/17/2022 2225   PROT 7.6 05/17/2022 2225   ALBUMIN 4.5 05/17/2022 2225   AST 22 05/17/2022 2225   ALT 20 05/17/2022 2225   ALKPHOS 53 05/17/2022 2225   BILITOT 0.6 05/17/2022 2225   GFRNONAA >60 05/17/2022 2225   GFRAA >60 10/25/2016 2300       Component Value Date/Time   WBC 3.2 (L) 05/17/2022 2225   RBC 4.69 05/17/2022 2225   HGB 14.5 05/17/2022 2225   HCT 44.4 05/17/2022 2225   PLT 167 05/17/2022 2225   MCV 94.7 05/17/2022 2225   MCH 30.9 05/17/2022 2225   MCHC 32.7 05/17/2022 2225   RDW 12.3 05/17/2022 2225   LYMPHSABS 0.7 05/17/2022 2225   MONOABS 0.8 05/17/2022 2225   EOSABS 0.0 05/17/2022 2225   BASOSABS 0.0 05/17/2022 2225    No results found for: "POCLITH", "LITHIUM"   No results found for: "PHENYTOIN", "PHENOBARB", "VALPROATE", "CBMZ"   .res Assessment: Plan:     Plan:  PDMP reviewed  Adderall 20mg  twice daily  Hydroxyzine 25mg  BID  BP 105/68/81  Consider SSRI   Discussed NAC tabs - Magnesium L-Threonate  Psych Central 45/58 ADD likely  RTC 3 months  Patient advised to contact office with any questions, adverse effects, or acute worsening in signs and symptoms.  Discussed potential benefits, risks, and side effects of stimulants with patient to include increased heart rate, palpitations, insomnia, increased anxiety, increased irritability, or decreased appetite.  Instructed patient to contact office if experiencing any significant tolerability issues.   There are no diagnoses linked to this encounter.   Please see After Visit Summary for patient specific instructions.  Future Appointments  Date Time Provider Department Center  03/04/2023  9:00 AM Thadd Apuzzo, Thereasa Solo, NP CP-CP  None    No orders of the defined types were placed in this encounter.   -------------------------------

## 2023-03-12 ENCOUNTER — Encounter (HOSPITAL_BASED_OUTPATIENT_CLINIC_OR_DEPARTMENT_OTHER): Payer: Self-pay | Admitting: Emergency Medicine

## 2023-03-12 ENCOUNTER — Emergency Department (HOSPITAL_BASED_OUTPATIENT_CLINIC_OR_DEPARTMENT_OTHER)
Admission: EM | Admit: 2023-03-12 | Discharge: 2023-03-12 | Disposition: A | Discharge status: Home / Self Care | Payer: Commercial Managed Care - HMO | Attending: Emergency Medicine | Admitting: Emergency Medicine

## 2023-03-12 ENCOUNTER — Other Ambulatory Visit: Payer: Self-pay

## 2023-03-12 DIAGNOSIS — U071 COVID-19: Secondary | ICD-10-CM | POA: Insufficient documentation

## 2023-03-12 DIAGNOSIS — R059 Cough, unspecified: Secondary | ICD-10-CM | POA: Diagnosis present

## 2023-03-12 LAB — RESP PANEL BY RT-PCR (RSV, FLU A&B, COVID)  RVPGX2
Influenza A by PCR: NEGATIVE
Influenza B by PCR: NEGATIVE
Resp Syncytial Virus by PCR: NEGATIVE
SARS Coronavirus 2 by RT PCR: POSITIVE — AB

## 2023-03-12 MED ORDER — ONDANSETRON 4 MG PO TBDP: ORAL_TABLET | ORAL | 0 refills | Status: AC

## 2023-03-12 MED ORDER — BENZONATATE 100 MG PO CAPS: 100.0000 mg | ORAL_CAPSULE | Freq: Three times a day (TID) | ORAL | 0 refills | Status: AC

## 2023-03-12 NOTE — ED Provider Notes (Signed)
 [place] EMERGENCY DEPARTMENT AT [facility] Provider Note   CSN: [id number] Arrival date & time: 03/12/23  1055     History  Chief Complaint  Patient presents with   Cough    Amanda Baird is a 43 y.o. female.  43 yo F with a chief complaints of cough congestion fevers chills sore throat.  This has been going on for about a month now.  No known sick contacts.  No recent travel.  She was worried because things just have not gotten better.   Cough      Home Medications Prior to Admission medications   Medication Sig Start Date End Date Taking? Authorizing Provider  benzonatate  (TESSALON ) 100 MG capsule Take 1 capsule (100 mg total) by mouth every 8 (eight) hours. 03/12/23  Yes Emil Share, DO  ondansetron  (ZOFRAN -ODT) 4 MG disintegrating tablet 4mg  ODT q4 hours prn nausea/vomit 03/12/23  Yes Taray Normoyle, DO  amphetamine -dextroamphetamine  (ADDERALL) 20 MG tablet Take 1 tablet (20 mg total) by mouth 2 (two) times daily. 03/04/23   Mozingo, Regina Nattalie, NP  amphetamine -dextroamphetamine  (ADDERALL) 20 MG tablet Take 1 tablet (20 mg total) by mouth 2 (two) times daily. 04/01/23   Mozingo, Regina Nattalie, NP  amphetamine -dextroamphetamine  (ADDERALL) 20 MG tablet Take 1 tablet (20 mg total) by mouth 2 (two) times daily. 04/29/23   Mozingo, Regina Nattalie, NP  hydrOXYzine  (ATARAX ) 25 MG tablet Take one to two tablets at bedtime as needed for sleep. 03/04/23   Mozingo, Regina Nattalie, NP      Allergies    Duloxetine    Review of Systems   Review of Systems  Respiratory:  Positive for cough.     Physical Exam Updated Vital Signs BP 112/78 (BP Location: Right Arm)   Pulse 78   Temp 98.6 F (37 C)   Resp 16   Ht 5' 4 (1.626 m)   Wt 61.2 kg   LMP 05/06/2015 (Exact Date)   SpO2 100%   BMI 23.17 kg/m  Physical Exam Vitals and nursing note reviewed.  Constitutional:      General: She is not in acute distress.    Appearance: She is well-developed. She is not  diaphoretic.  HENT:     Head: Normocephalic and atraumatic.     Comments: Swollen turbinates, posterior nasal drip, no noted sinus ttp.   Eyes:     Pupils: Pupils are equal, round, and reactive to light.  Cardiovascular:     Rate and Rhythm: Normal rate and regular rhythm.     Heart sounds: No murmur heard.    No friction rub. No gallop.  Pulmonary:     Effort: Pulmonary effort is normal.     Breath sounds: No wheezing or rales.  Abdominal:     General: There is no distension.     Palpations: Abdomen is soft.     Tenderness: There is no abdominal tenderness.  Musculoskeletal:        General: No tenderness.     Cervical back: Normal range of motion and neck supple.  Skin:    General: Skin is warm and dry.  Neurological:     Mental Status: She is alert and oriented to person, place, and time.  Psychiatric:        Behavior: Behavior normal.     ED Results / Procedures / Treatments   Labs (all labs ordered are listed, but only abnormal results are displayed) Labs Reviewed  RESP PANEL BY RT-PCR (RSV, FLU A&B, COVID)  [id number] -  Abnormal; Notable for the following components:      Result Value   SARS Coronavirus 2 by RT PCR POSITIVE (*)    All other components within normal limits    EKG None  Radiology No results found.  Procedures Procedures    Medications Ordered in ED Medications - No data to display  ED Course/ Medical Decision Making/ A&P                                 Medical Decision Making Risk Prescription drug management.   43 yo F with a chief complaints of about a month of cough and congestion.  Patient is well-appearing nontoxic.  Test positive for COVID.  Could be a normal timeline for COVID.  Maybe an extra week of coughing.  I do not appreciate any obvious bacterial source of infection on exam.  Clear lung sounds normal aeration no tachypnea.  Will continue to treat supportively.  PCP follow-up.  1:28 PM:  I have discussed the  diagnosis/risks/treatment options with the patient and family.  Evaluation and diagnostic testing in the emergency department does not suggest an emergent condition requiring admission or immediate intervention beyond what has been performed at this time.  They will follow up with PCP. We also discussed returning to the ED immediately if new or worsening sx occur. We discussed the sx which are most concerning (e.g., sudden worsening pain, fever, inability to tolerate by mouth) that necessitate immediate return. Medications administered to the patient during their visit and any new prescriptions provided to the patient are listed below.  Medications given during this visit Medications - No data to display   The patient appears reasonably screen and/or stabilized for discharge and I doubt any other medical condition or other [facility] requiring further screening, evaluation, or treatment in the ED at this time prior to discharge.          Final Clinical Impression(s) / ED Diagnoses Final diagnoses:  COVID-19 virus infection    Rx / DC Orders ED Discharge Orders          Ordered    benzonatate  (TESSALON ) 100 MG capsule  Every 8 hours        03/12/23 1326    ondansetron  (ZOFRAN -ODT) 4 MG disintegrating tablet        03/12/23 1326              Emil Share, DO 03/12/23 1328

## 2023-03-12 NOTE — ED Notes (Signed)
 Discharge instructions, follow up care, and prescriptions reviewed and explained, pt verbalized understanding and had no further questions on d/c. Pt caox4, ambulatory, NAD on d/c.

## 2023-03-12 NOTE — ED Triage Notes (Signed)
 C/o cough, body aches, and congestion since last night. Denies fevers.

## 2023-03-12 NOTE — Discharge Instructions (Signed)
 Take tylenol 2 pills 4 times a day and motrin 4 pills 3 times a day.  Drink plenty of fluids.  Return for worsening shortness of breath, headache, confusion. Follow up with your family doctor.

## 2023-05-31 ENCOUNTER — Telehealth: Payer: Medicaid Other | Admitting: Adult Health

## 2023-05-31 ENCOUNTER — Encounter: Payer: Self-pay | Admitting: Adult Health

## 2023-05-31 DIAGNOSIS — F909 Attention-deficit hyperactivity disorder, unspecified type: Secondary | ICD-10-CM

## 2023-05-31 DIAGNOSIS — F902 Attention-deficit hyperactivity disorder, combined type: Secondary | ICD-10-CM

## 2023-05-31 DIAGNOSIS — G4700 Insomnia, unspecified: Secondary | ICD-10-CM | POA: Diagnosis not present

## 2023-05-31 MED ORDER — AMPHETAMINE-DEXTROAMPHETAMINE 20 MG PO TABS
20.0000 mg | ORAL_TABLET | Freq: Two times a day (BID) | ORAL | 0 refills | Status: DC
Start: 2023-07-26 — End: 2023-08-30

## 2023-05-31 MED ORDER — AMPHETAMINE-DEXTROAMPHETAMINE 20 MG PO TABS: 20.0000 mg | ORAL_TABLET | Freq: Two times a day (BID) | ORAL | 0 refills | Status: DC

## 2023-05-31 NOTE — Progress Notes (Signed)
 Amanda Baird [id number] 08-31-1980 43 y.o.  Virtual Visit via Video Note  I connected with pt @ on 05/31/23 at  9:00 AM EDT by a video enabled telemedicine application and verified that I am speaking with the correct person using two identifiers.   I discussed the limitations of evaluation and management by telemedicine and the availability of in person appointments. The patient expressed understanding and agreed to proceed.  I discussed the assessment and treatment plan with the patient. The patient was provided an opportunity to ask questions and all were answered. The patient agreed with the plan and demonstrated an understanding of the instructions.   The patient was advised to call back or seek an in-person evaluation if the symptoms worsen or if the condition fails to improve as anticipated.  I provided 15 minutes of non-face-to-face time during this encounter.  The patient was located at home.  The provider was located at [facility].   Dorothyann Gibbs, NP   Subjective:   Patient ID:  Amanda Baird is a 43 y.o. (DOB [birth year]) female.  Chief Complaint: No chief complaint on file.   HPI Amanda Baird presents for follow-up of ADD and insomnia.  Describes mood today as "ok". Pleasant. Denies tearfulness. Mood symptoms - denies depression, anxiety and irritability. Denies panic attacks. Reports some worry, rumination and over thinking. Mood is stable. Feels like current medication regimen works well. Stable interest and motivation. Taking medications as prescribed.  Energy levels stable. Active, does not have a regular exercise routine. Enjoys some usual interests and activities. Married. Has 5 children between. Spending time with family. Appetite adequate. Weight gain - 136 to 138 pounds. Reports sleep has improved. Averages 7 hours.  Focus and concentration improved. Feels like the Adderall is helpful. Completing tasks. Managing aspects of household. 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 Enrolled in school - behavioral analyst. Denies SI or HI.  Denies AH or VH. Denies self harm. Denies substance use. Denies alcohol use.   Previous medication trials:  Zoloft, Cymbalta    Review of Systems:  Review of Systems  Musculoskeletal:  Negative for gait problem.  Neurological:  Negative for tremors.  Psychiatric/Behavioral:         Please refer to HPI    Medications: I have reviewed the patient's current medications.  Current Outpatient Medications  Medication Sig Dispense Refill   amphetamine-dextroamphetamine (ADDERALL) 20 MG tablet Take 1 tablet (20 mg total) by mouth 2 (two) times daily. 60 tablet 0   amphetamine-dextroamphetamine (ADDERALL) 20 MG tablet Take 1 tablet (20 mg total) by mouth 2 (two) times daily. 60 tablet 0   amphetamine-dextroamphetamine (ADDERALL) 20 MG tablet Take 1 tablet (20 mg total) by mouth 2 (two) times daily. 60 tablet 0   benzonatate (TESSALON) 100 MG capsule Take 1 capsule (100 mg total) by mouth every 8 (eight) hours. 21 capsule 0   hydrOXYzine (ATARAX) 25 MG tablet Take one to two tablets at bedtime as needed for sleep. 60 tablet 5   ondansetron (ZOFRAN-ODT) 4 MG disintegrating tablet 4mg  ODT q4 hours prn nausea/vomit 20 tablet 0   No current facility-administered medications for this visit.    Medication Side Effects: None  Allergies:  Allergies  Allergen Reactions   Duloxetine Other (See Comments)    No appetite.    No past medical history on file.  No family history on file.  Social History   Socioeconomic History   Marital status: Married  Spouse name: Not on file   Number of children: Not on file   Years of education: Not on file   Highest education level: Not on file  Occupational History   Not on file  Tobacco Use   Smoking status: Never   Smokeless tobacco: Never  Vaping Use   Vaping status: Never Used  Substance and Sexual Activity    Alcohol use: Not Currently    Comment: occ.   Drug use: No   Sexual activity: Not on file  Other Topics Concern   Not on file  Social History Narrative   Not on file   Social Drivers of Health   Financial Resource Strain: Not on file  Food Insecurity: Not on file  Transportation Needs: Not on file  Physical Activity: Not on file  Stress: Not on file  Social Connections: Not on file  Intimate Partner Violence: Not on file    Past Medical History, Surgical history, Social history, and Family history were reviewed and updated as appropriate.   Please see review of systems for further details on the patient's review from today.   Objective:   Physical Exam:  LMP 05/06/2015 (Exact Date)   Physical Exam Constitutional:      General: She is not in acute distress. Musculoskeletal:        General: No deformity.  Neurological:     Mental Status: She is alert and oriented to person, place, and time.     Coordination: Coordination normal.  Psychiatric:        Attention and Perception: Attention and perception normal. She does not perceive auditory or visual hallucinations.        Mood and Affect: Affect is not labile, blunt, angry or inappropriate.        Speech: Speech normal.        Behavior: Behavior normal.        Thought Content: Thought content normal. Thought content is not paranoid or delusional. Thought content does not include homicidal or suicidal ideation. Thought content does not include homicidal or suicidal plan.        Cognition and Memory: Cognition and memory normal.        Judgment: Judgment normal.     Comments: Insight intact     Lab Review:     Component Value Date/Time   NA 139 05/17/2022 2225   K 3.7 05/17/2022 2225   CL 101 05/17/2022 2225   CO2 28 05/17/2022 2225   GLUCOSE 83 05/17/2022 2225   BUN 9 05/17/2022 2225   CREATININE 1.10 (H) 05/17/2022 2225   CALCIUM 9.7 05/17/2022 2225   PROT 7.6 05/17/2022 2225   ALBUMIN 4.5 05/17/2022 2225    AST 22 05/17/2022 2225   ALT 20 05/17/2022 2225   ALKPHOS 53 05/17/2022 2225   BILITOT 0.6 05/17/2022 2225   GFRNONAA >60 05/17/2022 2225   GFRAA >60 10/25/2016 2300       Component Value Date/Time   WBC 3.2 (L) 05/17/2022 2225   RBC 4.69 05/17/2022 2225   HGB 14.5 05/17/2022 2225   HCT 44.4 05/17/2022 2225   PLT 167 05/17/2022 2225   MCV 94.7 05/17/2022 2225   MCH 30.9 05/17/2022 2225   MCHC 32.7 05/17/2022 2225   RDW 12.3 05/17/2022 2225   LYMPHSABS 0.7 05/17/2022 2225   MONOABS 0.8 05/17/2022 2225   EOSABS 0.0 05/17/2022 2225   BASOSABS 0.0 05/17/2022 2225    No results found for: "POCLITH", "LITHIUM"   No results found for: "  PHENYTOIN", "PHENOBARB", "VALPROATE", "CBMZ"   .res Assessment: Plan:    Plan:  PDMP reviewed  Adderall 20mg  twice daily  Hydroxyzine 25mg  BID  Consider SSRI   Discussed NAC tabs - Magnesium L-Threonate  [facility] 45/58 ADD likely  RTC 3 months  15 minutes spent dedicated to the care of this patient on the date of this encounter to include pre-visit review of records, ordering of medication, post visit documentation, and face-to-face time with the patient discussing ADD and insomnia. Discussed continuing current medication regimen.  Patient advised to contact office with any questions, adverse effects, or acute worsening in signs and symptoms.  Discussed potential benefits, risks, and side effects of stimulants with patient to include increased heart rate, palpitations, insomnia, increased anxiety, increased irritability, or decreased appetite.  Instructed patient to contact office if experiencing any significant tolerability issues.    There are no diagnoses linked to this encounter.   Please see After Visit Summary for patient specific instructions.  Future Appointments  Date Time Provider Department Center  05/31/2023  9:00 AM Amanda Baird, Thereasa Solo, NP [facility] None    No orders of the defined types were placed in this  encounter.     -------------------------------

## 2023-06-10 ENCOUNTER — Emergency Department (HOSPITAL_BASED_OUTPATIENT_CLINIC_OR_DEPARTMENT_OTHER)
Admission: EM | Admit: 2023-06-10 | Discharge: 2023-06-11 | Disposition: A | Discharge status: Home / Self Care | Attending: Emergency Medicine | Admitting: Emergency Medicine

## 2023-06-10 ENCOUNTER — Other Ambulatory Visit: Payer: Self-pay

## 2023-06-10 ENCOUNTER — Emergency Department (HOSPITAL_BASED_OUTPATIENT_CLINIC_OR_DEPARTMENT_OTHER)

## 2023-06-10 ENCOUNTER — Encounter (HOSPITAL_BASED_OUTPATIENT_CLINIC_OR_DEPARTMENT_OTHER): Payer: Self-pay

## 2023-06-10 DIAGNOSIS — R079 Chest pain, unspecified: Secondary | ICD-10-CM | POA: Diagnosis not present

## 2023-06-10 DIAGNOSIS — R202 Paresthesia of skin: Secondary | ICD-10-CM | POA: Insufficient documentation

## 2023-06-10 DIAGNOSIS — M79601 Pain in right arm: Secondary | ICD-10-CM | POA: Insufficient documentation

## 2023-06-10 DIAGNOSIS — M7989 Other specified soft tissue disorders: Secondary | ICD-10-CM | POA: Diagnosis not present

## 2023-06-10 NOTE — ED Triage Notes (Signed)
 Pt states PTA she noticed her rt. Arm and hand were swollen and veins were protruding Denies any injury Pain 4/10

## 2023-06-11 ENCOUNTER — Emergency Department (HOSPITAL_BASED_OUTPATIENT_CLINIC_OR_DEPARTMENT_OTHER)

## 2023-06-11 DIAGNOSIS — M79601 Pain in right arm: Secondary | ICD-10-CM | POA: Diagnosis not present

## 2023-06-11 LAB — CBC WITH DIFFERENTIAL/PLATELET
Abs Immature Granulocytes: 0 10*3/uL (ref 0.00–0.07)
Basophils Absolute: 0 10*3/uL (ref 0.0–0.1)
Basophils Relative: 1 %
Eosinophils Absolute: 0.2 10*3/uL (ref 0.0–0.5)
Eosinophils Relative: 6 %
HCT: 43.6 % (ref 36.0–46.0)
Hemoglobin: 14.2 g/dL (ref 12.0–15.0)
Immature Granulocytes: 0 %
Lymphocytes Relative: 31 %
Lymphs Abs: 1.2 10*3/uL (ref 0.7–4.0)
MCH: 30.7 pg (ref 26.0–34.0)
MCHC: 32.6 g/dL (ref 30.0–36.0)
MCV: 94.4 fL (ref 80.0–100.0)
Monocytes Absolute: 0.8 10*3/uL (ref 0.1–1.0)
Monocytes Relative: 21 %
Neutro Abs: 1.6 10*3/uL — ABNORMAL LOW (ref 1.7–7.7)
Neutrophils Relative %: 41 %
Platelets: 197 10*3/uL (ref 150–400)
RBC: 4.62 MIL/uL (ref 3.87–5.11)
RDW: 12.7 % (ref 11.5–15.5)
WBC: 3.7 10*3/uL — ABNORMAL LOW (ref 4.0–10.5)
nRBC: 0 % (ref 0.0–0.2)

## 2023-06-11 LAB — COMPREHENSIVE METABOLIC PANEL WITH GFR
ALT: 9 U/L (ref 0–44)
AST: 14 U/L — ABNORMAL LOW (ref 15–41)
Albumin: 4.4 g/dL (ref 3.5–5.0)
Alkaline Phosphatase: 53 U/L (ref 38–126)
Anion gap: 7 (ref 5–15)
BUN: 11 mg/dL (ref 6–20)
CO2: 31 mmol/L (ref 22–32)
Calcium: 9.2 mg/dL (ref 8.9–10.3)
Chloride: 101 mmol/L (ref 98–111)
Creatinine, Ser: 0.87 mg/dL (ref 0.44–1.00)
GFR, Estimated: >60 mL/min (ref >60)
Glucose, Bld: 86 mg/dL (ref 70–99)
Potassium: 3.5 mmol/L (ref 3.5–5.1)
Sodium: 139 mmol/L (ref 135–145)
Total Bilirubin: 0.5 mg/dL (ref 0.0–1.2)
Total Protein: 7.3 g/dL (ref 6.5–8.1)

## 2023-06-11 LAB — D-DIMER, QUANTITATIVE: D-Dimer, Quant: 0.3 ug{FEU}/mL (ref 0.00–0.50)

## 2023-06-11 MED ORDER — IOHEXOL 300 MG/ML  SOLN
75.0000 mL | Freq: Once | INTRAMUSCULAR | Status: AC | PRN
  Administered 2023-06-11: 75 mL via INTRAVENOUS

## 2023-06-11 NOTE — ED Provider Notes (Signed)
 [facility] AT [facility] Provider Note   CSN: [id number] Arrival date & time: 06/10/23  2027     History  Chief Complaint  Patient presents with   arm swelling and tingling    Amanda Baird is a 43 y.o. female.  43 yo F here with right arm pain and swelling. States she had some paresthesias in her BLE's last week that was brief. Tonight while eating she noticed paresthesias in her R arm and then associated swelling, venous distension that since resolved. On testosterone 2/2 partial hysterectomy. No h/o dvt. No h/o HTN. No other associated symptoms.         Home Medications Prior to Admission medications   Medication Sig Start Date End Date Taking? Authorizing Provider  amphetamine-dextroamphetamine (ADDERALL) 20 MG tablet Take 1 tablet (20 mg total) by mouth 2 (two) times daily. 05/31/23   Mozingo, Thereasa Solo, NP  amphetamine-dextroamphetamine (ADDERALL) 20 MG tablet Take 1 tablet (20 mg total) by mouth 2 (two) times daily. 06/28/23   Mozingo, Thereasa Solo, NP  amphetamine-dextroamphetamine (ADDERALL) 20 MG tablet Take 1 tablet (20 mg total) by mouth 2 (two) times daily. 07/26/23   Mozingo, Thereasa Solo, NP  benzonatate (TESSALON) 100 MG capsule Take 1 capsule (100 mg total) by mouth every 8 (eight) hours. 03/12/23   Melene Plan, DO  hydrOXYzine (ATARAX) 25 MG tablet Take one to two tablets at bedtime as needed for sleep. 03/04/23   Mozingo, Thereasa Solo, NP  ondansetron (ZOFRAN-ODT) 4 MG disintegrating tablet 4mg  ODT q4 hours prn nausea/vomit 03/12/23   Melene Plan, DO      Allergies    Duloxetine    Review of Systems   Review of Systems  Physical Exam Updated Vital Signs BP 109/71   Pulse 70   Temp 98.5 F (36.9 C) (Oral)   Resp 18   Ht 5\' 4"  (1.626 m)   Wt 62.1 kg   LMP 05/06/2015 (Exact Date)   SpO2 100%   BMI 23.52 kg/m  Physical Exam Vitals and nursing note reviewed.  Constitutional:      Appearance: She is  well-developed.  HENT:     Head: Normocephalic and atraumatic.  Eyes:     Pupils: Pupils are equal, round, and reactive to light.  Cardiovascular:     Rate and Rhythm: Normal rate and regular rhythm.     Comments: Symmetric radial pulses Pulmonary:     Effort: No respiratory distress.     Breath sounds: No stridor.  Abdominal:     General: There is no distension.  Musculoskeletal:        General: No swelling or tenderness. Normal range of motion.     Cervical back: Normal range of motion.  Skin:    General: Skin is warm and dry.  Neurological:     General: No focal deficit present.     Mental Status: She is alert.     ED Results / Procedures / Treatments   Labs (all labs ordered are listed, but only abnormal results are displayed) Labs Reviewed  CBC WITH DIFFERENTIAL/PLATELET - Abnormal; Notable for the following components:      Result Value   WBC 3.7 (*)    Neutro Abs 1.6 (*)    All other components within normal limits  COMPREHENSIVE METABOLIC PANEL WITH GFR - Abnormal; Notable for the following components:   AST 14 (*)    All other components within normal limits  D-DIMER, QUANTITATIVE    EKG None  Radiology CT Chest W Contrast Result Date: 06/11/2023 CLINICAL DATA:  evaluate for anatomy in shoulder/thoracic outlet syndrome. She noticed her rt. Arm and hand were swollen and veins were protruding EXAM: CT CHEST WITH CONTRAST TECHNIQUE: Multidetector CT imaging of the chest was performed during intravenous contrast administration. RADIATION DOSE REDUCTION: This exam was performed according to the departmental dose-optimization program which includes automated exposure control, adjustment of the mA and/or kV according to patient size and/or use of iterative reconstruction technique. CONTRAST:  75mL OMNIPAQUE IOHEXOL 300 MG/ML  SOLN COMPARISON:  Right upper extremity ultrasound 06/11/2023, chest x-ray 05/17/2022 FINDINGS: Cardiovascular: Normal heart size. No significant  pericardial effusion. The thoracic aorta is normal in caliber. No atherosclerotic plaque of the thoracic aorta. No coronary artery calcifications. Mediastinum/Nodes: No enlarged mediastinal, hilar, or axillary lymph nodes. Thyroid gland, trachea, and esophagus demonstrate no significant findings. Lungs/Pleura: No focal consolidation. No pulmonary nodule. No pulmonary mass. No pleural effusion. No pneumothorax. Upper Abdomen: No acute abnormality. Musculoskeletal: No abdominal wall hernia or abnormality. No suspicious lytic or blastic osseous lesions. No acute displaced fracture. IMPRESSION: No acute intrathoracic abnormality. Electronically Signed   By: Tish Frederickson M.D.   On: 06/11/2023 03:39   US Venous Img Upper Right (DVT Study) Result Date: 06/11/2023 CLINICAL DATA:  Right upper extremity tingling and swelling. EXAM: RIGHT UPPER EXTREMITY VENOUS DOPPLER ULTRASOUND TECHNIQUE: Gray-scale sonography with graded compression, as well as color Doppler and duplex ultrasound were performed to evaluate the upper extremity deep venous system from the level of the subclavian vein and including the jugular, axillary, basilic, radial, ulnar and upper cephalic vein. Spectral Doppler was utilized to evaluate flow at rest and with distal augmentation maneuvers. COMPARISON:  None Available. FINDINGS: Contralateral Subclavian Vein: Respiratory phasicity is normal and symmetric with the symptomatic side. No evidence of thrombus. Normal compressibility. Internal Jugular Vein: No evidence of thrombus. Normal compressibility, respiratory phasicity and response to augmentation. Subclavian Vein: No evidence of thrombus. Normal compressibility, respiratory phasicity and response to augmentation. Axillary Vein: No evidence of thrombus. Normal compressibility, respiratory phasicity and response to augmentation. Cephalic Vein: No evidence of thrombus. Normal compressibility, respiratory phasicity and response to augmentation. Basilic  Vein: No evidence of thrombus. Normal compressibility, respiratory phasicity and response to augmentation. Brachial Veins: No evidence of thrombus. Normal compressibility, respiratory phasicity and response to augmentation. Radial Veins: No evidence of thrombus. Normal compressibility, respiratory phasicity and response to augmentation. Ulnar Veins: No evidence of thrombus. Normal compressibility, respiratory phasicity and response to augmentation. Venous Reflux:  None visualized. Other Findings:  None visualized. IMPRESSION: No evidence of DVT within the RIGHT upper extremity. Electronically Signed   By: Aram Candela M.D.   On: 06/11/2023 01:30    Procedures Procedures    Medications Ordered in ED Medications  iohexol (OMNIPAQUE) 300 MG/ML solution 75 mL (75 mLs Intravenous Contrast Given 06/11/23 0141)    ED Course/ Medical Decision Making/ A&P                                 Medical Decision Making Amount and/or Complexity of Data Reviewed Labs: ordered. Radiology: ordered.  Risk Prescription drug management.   Will eval for dvt vs thoracic outlet syndrome.   [place] negative. CT scan reassuring. Feels fine now. Will fu w/ pcp if symptoms return, here for new/worsening symptoms.   Final Clinical Impression(s) / ED Diagnoses Final diagnoses:  Right arm pain    Rx / DC  Orders ED Discharge Orders     None         Datrell Dunton, Barbara Cower, MD 06/11/23 [phone]

## 2023-08-30 ENCOUNTER — Encounter: Payer: Self-pay | Admitting: Adult Health

## 2023-08-30 ENCOUNTER — Telehealth (INDEPENDENT_AMBULATORY_CARE_PROVIDER_SITE_OTHER): Admitting: Adult Health

## 2023-08-30 DIAGNOSIS — F902 Attention-deficit hyperactivity disorder, combined type: Secondary | ICD-10-CM

## 2023-08-30 DIAGNOSIS — F909 Attention-deficit hyperactivity disorder, unspecified type: Secondary | ICD-10-CM | POA: Diagnosis not present

## 2023-08-30 DIAGNOSIS — G4700 Insomnia, unspecified: Secondary | ICD-10-CM | POA: Diagnosis not present

## 2023-08-30 MED ORDER — AMPHETAMINE-DEXTROAMPHETAMINE 20 MG PO TABS
20.0000 mg | ORAL_TABLET | Freq: Two times a day (BID) | ORAL | 0 refills | Status: DC
Start: 2023-10-25 — End: 2023-12-02

## 2023-08-30 MED ORDER — HYDROXYZINE HCL 25 MG PO TABS
ORAL_TABLET | ORAL | 5 refills | Status: AC
Start: 2023-08-30 — End: ?

## 2023-08-30 MED ORDER — AMPHETAMINE-DEXTROAMPHETAMINE 20 MG PO TABS
20.0000 mg | ORAL_TABLET | Freq: Two times a day (BID) | ORAL | 0 refills | Status: DC
Start: 2023-08-30 — End: 2023-12-02

## 2023-08-30 MED ORDER — AMPHETAMINE-DEXTROAMPHETAMINE 20 MG PO TABS: 20.0000 mg | ORAL_TABLET | Freq: Two times a day (BID) | ORAL | 0 refills | Status: DC

## 2023-08-30 NOTE — Progress Notes (Signed)
 Amanda Baird [id number] [birth year] 43 y.o.  Virtual Visit via Video Note  I connected with pt @ on 08/30/23 at  9:30 AM EDT by a video enabled telemedicine application and verified that I am speaking with the correct person using two identifiers.   I discussed the limitations of evaluation and management by telemedicine and the availability of in person appointments. The patient expressed understanding and agreed to proceed.  I discussed the assessment and treatment plan with the patient. The patient was provided an opportunity to ask questions and all were answered. The patient agreed with the plan and demonstrated an understanding of the instructions.   The patient was advised to call back or seek an in-person evaluation if the symptoms worsen or if the condition fails to improve as anticipated.  I provided 15 minutes of non-face-to-face time during this encounter.  The patient was located at home.  The provider was located at [facility].   Angeline LOISE Sayers, NP   Subjective:   Patient ID:  Amanda Baird is a 43 y.o. (DOB 01/10/1981) female.  Chief Complaint: No chief complaint on file.   HPI Amanda Baird presents for follow-up of ADD and insomnia.  Describes mood today as ok. Pleasant. Denies tearfulness. Mood symptoms - denies depression, anxiety and irritability. Reports stable interest and motivation. Denies panic attacks. Reports some worry, rumination and over thinking. Reports mood is stable. Feels like current medication regimen works well. Taking medications as prescribed.  Energy levels stable. Active, does not have a regular exercise routine. Enjoys some usual interests and activities. Married. Has 5 children between. Spending time with family. Appetite adequate. Weight gain - 136 to 142 pounds. Reports sleep has improved. Averages 7 hours.  Focus and concentration improved. Feels like the Adderall is helpful. Completing tasks. Managing aspects of  household. 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 Denies SI or HI.  Denies AH or VH. Denies self harm. Denies substance use. Denies alcohol use.   Previous medication trials:  Zoloft, Cymbalta   Review of Systems:  Review of Systems  Medications: I have reviewed the patient's current medications.  Current Outpatient Medications  Medication Sig Dispense Refill   amphetamine -dextroamphetamine  (ADDERALL) 20 MG tablet Take 1 tablet (20 mg total) by mouth 2 (two) times daily. 60 tablet 0   amphetamine -dextroamphetamine  (ADDERALL) 20 MG tablet Take 1 tablet (20 mg total) by mouth 2 (two) times daily. 60 tablet 0   amphetamine -dextroamphetamine  (ADDERALL) 20 MG tablet Take 1 tablet (20 mg total) by mouth 2 (two) times daily. 60 tablet 0   benzonatate  (TESSALON ) 100 MG capsule Take 1 capsule (100 mg total) by mouth every 8 (eight) hours. 21 capsule 0   hydrOXYzine  (ATARAX ) 25 MG tablet Take one to two tablets at bedtime as needed for sleep. 60 tablet 5   ondansetron  (ZOFRAN -ODT) 4 MG disintegrating tablet 4mg  ODT q4 hours prn nausea/vomit 20 tablet 0   No current facility-administered medications for this visit.    Medication Side Effects: None  Allergies:  Allergies  Allergen Reactions   Duloxetine Other (See Comments)    No appetite.    No past medical history on file.  No family history on file.  Social History   Socioeconomic History   Marital status: Married    Spouse name: Not on file   Number of children: Not on file   Years of education: Not on file   Highest  education level: Not on file  Occupational History   Not on file  Tobacco Use   Smoking status: Never   Smokeless tobacco: Never  Vaping Use   Vaping status: Never Used  Substance and Sexual Activity   Alcohol use: Not Currently    Comment: occ.   Drug use: No   Sexual activity: Not on file  Other Topics Concern    Not on file  Social History Narrative   Not on file   Social Drivers of Health   Financial Resource Strain: Not on file  Food Insecurity: Not on file  Transportation Needs: Not on file  Physical Activity: Not on file  Stress: Not on file  Social Connections: Not on file  Intimate Partner Violence: Not on file    Past Medical History, Surgical history, Social history, and Family history were reviewed and updated as appropriate.   Please see review of systems for further details on the patient's review from today.   Objective:   Physical Exam:  LMP 05/06/2015 (Exact Date)   Physical Exam Constitutional:      General: She is not in acute distress.  Musculoskeletal:        General: No deformity.   Neurological:     Mental Status: She is alert and oriented to person, place, and time.     Coordination: Coordination normal.   Psychiatric:        Attention and Perception: Attention and perception normal. She does not perceive auditory or visual hallucinations.        Mood and Affect: Mood normal. Mood is not anxious or depressed. Affect is not labile, blunt, angry or inappropriate.        Speech: Speech normal.        Behavior: Behavior normal.        Thought Content: Thought content normal. Thought content is not paranoid or delusional. Thought content does not include homicidal or suicidal ideation. Thought content does not include homicidal or suicidal plan.        Cognition and Memory: Cognition and memory normal.        Judgment: Judgment normal.     Comments: Insight intact     Lab Review:     Component Value Date/Time   NA 139 06/11/2023 0028   K 3.5 06/11/2023 0028   CL 101 06/11/2023 0028   CO2 31 06/11/2023 0028   GLUCOSE 86 06/11/2023 0028   BUN 11 06/11/2023 0028   CREATININE 0.87 06/11/2023 0028   CALCIUM 9.2 06/11/2023 0028   PROT 7.3 06/11/2023 0028   ALBUMIN 4.4 06/11/2023 0028   AST 14 (L) 06/11/2023 0028   ALT 9 06/11/2023 0028   ALKPHOS 53  06/11/2023 0028   BILITOT 0.5 06/11/2023 0028   GFRNONAA >60 06/11/2023 0028   GFRAA >60 10/25/2016 2300       Component Value Date/Time   WBC 3.7 (L) 06/11/2023 0028   RBC 4.62 06/11/2023 0028   HGB 14.2 06/11/2023 0028   HCT 43.6 06/11/2023 0028   PLT 197 06/11/2023 0028   MCV 94.4 06/11/2023 0028   MCH 30.7 06/11/2023 0028   MCHC 32.6 06/11/2023 0028   RDW 12.7 06/11/2023 0028   LYMPHSABS 1.2 06/11/2023 0028   MONOABS 0.8 06/11/2023 0028   EOSABS 0.2 06/11/2023 0028   BASOSABS 0.0 06/11/2023 0028    No results found for: POCLITH, LITHIUM   No results found for: PHENYTOIN, PHENOBARB, VALPROATE, CBMZ   .res Assessment: Plan:    Plan:  PDMP reviewed  Adderall 20mg  twice daily  Hydroxyzine  25mg  BID  Discussed NAC tabs - Magnesium  L-Threonate  [facility] 45/58 ADD likely  RTC 3 months  15 minutes spent dedicated to the care of this patient on the date of this encounter to include pre-visit review of records, ordering of medication, post visit documentation, and face-to-face time with the patient discussing ADD and insomnia. Discussed continuing current medication regimen.  Patient advised to contact office with any questions, adverse effects, or acute worsening in signs and symptoms.  Discussed potential benefits, risks, and side effects of stimulants with patient to include increased heart rate, palpitations, insomnia, increased anxiety, increased irritability, or decreased appetite.  Instructed patient to contact office if experiencing any significant tolerability issues.  There are no diagnoses linked to this encounter.   Please see After Visit Summary for patient specific instructions.  No future appointments.  No orders of the defined types were placed in this encounter.     -------------------------------

## 2023-11-01 ENCOUNTER — Encounter (HOSPITAL_BASED_OUTPATIENT_CLINIC_OR_DEPARTMENT_OTHER): Payer: Self-pay

## 2023-11-01 ENCOUNTER — Emergency Department (HOSPITAL_BASED_OUTPATIENT_CLINIC_OR_DEPARTMENT_OTHER)
Admission: EM | Admit: 2023-11-01 | Discharge: 2023-11-01 | Disposition: A | Discharge status: Home / Self Care | Attending: Emergency Medicine | Admitting: Emergency Medicine

## 2023-11-01 ENCOUNTER — Other Ambulatory Visit: Payer: Self-pay

## 2023-11-01 DIAGNOSIS — M5442 Lumbago with sciatica, left side: Secondary | ICD-10-CM | POA: Diagnosis present

## 2023-11-01 DIAGNOSIS — M5441 Lumbago with sciatica, right side: Secondary | ICD-10-CM | POA: Insufficient documentation

## 2023-11-01 DIAGNOSIS — R5383 Other fatigue: Secondary | ICD-10-CM | POA: Diagnosis not present

## 2023-11-01 LAB — URINALYSIS, W/ REFLEX TO CULTURE (INFECTION SUSPECTED)
Bacteria, UA: NONE SEEN
Bilirubin Urine: NEGATIVE
Glucose, UA: NEGATIVE mg/dL
Ketones, ur: NEGATIVE mg/dL
Nitrite: NEGATIVE
Protein, ur: NEGATIVE mg/dL
Specific Gravity, Urine: 1.02 (ref 1.005–1.030)
pH: 5.5 (ref 5.0–8.0)

## 2023-11-01 LAB — CBC WITH DIFFERENTIAL/PLATELET
Abs Immature Granulocytes: 0.01 K/uL (ref 0.00–0.07)
Basophils Absolute: 0 K/uL (ref 0.0–0.1)
Basophils Relative: 1 %
Eosinophils Absolute: 0.1 K/uL (ref 0.0–0.5)
Eosinophils Relative: 2 %
HCT: 43.4 % (ref 36.0–46.0)
Hemoglobin: 14 g/dL (ref 12.0–15.0)
Immature Granulocytes: 0 %
Lymphocytes Relative: 35 %
Lymphs Abs: 1.5 K/uL (ref 0.7–4.0)
MCH: 30.6 pg (ref 26.0–34.0)
MCHC: 32.3 g/dL (ref 30.0–36.0)
MCV: 94.8 fL (ref 80.0–100.0)
Monocytes Absolute: 0.5 K/uL (ref 0.1–1.0)
Monocytes Relative: 11 %
Neutro Abs: 2.2 K/uL (ref 1.7–7.7)
Neutrophils Relative %: 51 %
Platelets: 202 K/uL (ref 150–400)
RBC: 4.58 MIL/uL (ref 3.87–5.11)
RDW: 11.9 % (ref 11.5–15.5)
WBC: 4.3 K/uL (ref 4.0–10.5)
nRBC: 0 % (ref 0.0–0.2)

## 2023-11-01 LAB — BASIC METABOLIC PANEL WITH GFR
Anion gap: 10 (ref 5–15)
BUN: 12 mg/dL (ref 6–20)
CO2: 27 mmol/L (ref 22–32)
Calcium: 9.5 mg/dL (ref 8.9–10.3)
Chloride: 104 mmol/L (ref 98–111)
Creatinine, Ser: 0.97 mg/dL (ref 0.44–1.00)
GFR, Estimated: >60 mL/min (ref >60)
Glucose, Bld: 91 mg/dL (ref 70–99)
Potassium: 3.8 mmol/L (ref 3.5–5.1)
Sodium: 141 mmol/L (ref 135–145)

## 2023-11-01 MED ORDER — DEXAMETHASONE SODIUM PHOSPHATE 10 MG/ML IJ SOLN
10.0000 mg | Freq: Once | INTRAMUSCULAR | Status: AC
  Administered 2023-11-01: 10 mg via INTRAMUSCULAR
  Filled 2023-11-01: qty 1

## 2023-11-01 NOTE — ED Provider Notes (Signed)
 [place] EMERGENCY DEPARTMENT AT [facility] Provider Note   CSN: [id number] Arrival date & time: 11/01/23  2018     Patient presents with: No chief complaint on file.   Amanda Baird is a 43 y.o. female.   Patient is a 43 year old female who presents with tingling in her lower extremities.  She has had prior herniated disc in her back.  She said initially the tingling started a few weeks ago and was intermittent.  Now it is more persistent.  She describes it as a pins-and-needles type feeling in her lower legs bilaterally.  She denies any numbness in the leg.  She denies any weakness in the leg other than she feels fatigued all over.  She denies any known fevers.  No recent injuries.  She said if she lays on her left side which is where she had her back issues in the past, the pins-and-needles feeling becomes more prominent in the left leg.  She does not actually have any back pain and less sometimes when she is laying on her left side in her left back hurts.  She denies any loss of bowel or bladder control.  No recent injuries.  She has been feeling fatigued over about the last 2 weeks.  She denies any cough or cold symptoms.  No vomiting or diarrhea.  No chest pain or shortness of breath.  She has had some urinary frequency but none today.  No burning on urination.  No abdominal pain.       Prior to Admission medications   Medication Sig Start Date End Date Taking? Authorizing Provider  amphetamine -dextroamphetamine  (ADDERALL) 20 MG tablet Take 1 tablet (20 mg total) by mouth 2 (two) times daily. 08/30/23   Mozingo, Regina Nattalie, NP  amphetamine -dextroamphetamine  (ADDERALL) 20 MG tablet Take 1 tablet (20 mg total) by mouth 2 (two) times daily. 09/27/23   Mozingo, Regina Nattalie, NP  amphetamine -dextroamphetamine  (ADDERALL) 20 MG tablet Take 1 tablet (20 mg total) by mouth 2 (two) times daily. 10/25/23   Mozingo, Regina Nattalie, NP  benzonatate  (TESSALON ) 100 MG capsule  Take 1 capsule (100 mg total) by mouth every 8 (eight) hours. 03/12/23   Emil Share, DO  hydrOXYzine  (ATARAX ) 25 MG tablet Take one to two tablets at bedtime as needed for sleep. 08/30/23   Mozingo, Regina Nattalie, NP  ondansetron  (ZOFRAN -ODT) 4 MG disintegrating tablet 4mg  ODT q4 hours prn nausea/vomit 03/12/23   Floyd, Dan, DO    Allergies: Duloxetine    Review of Systems  Constitutional:  Positive for fatigue. Negative for chills, diaphoresis and fever.  HENT:  Negative for congestion, rhinorrhea and sneezing.   Eyes: Negative.   Respiratory:  Negative for cough, chest tightness and shortness of breath.   Cardiovascular:  Negative for chest pain and leg swelling.  Gastrointestinal:  Negative for abdominal pain, diarrhea, nausea and vomiting.  Genitourinary:  Positive for frequency. Negative for difficulty urinating, dysuria and flank pain.  Musculoskeletal:  Positive for back pain (Slight). Negative for arthralgias.       Paresthesias to the lower legs  Skin:  Negative for rash.  Neurological:  Positive for numbness (More pins-and-needles feeling rather than numbness). Negative for dizziness, speech difficulty, weakness and headaches.    Updated Vital Signs BP 124/84 (BP Location: Right Arm)   Pulse 79   Temp 97.7 F (36.5 C) (Oral)   Resp 15   LMP 05/06/2015 (Exact Date)   SpO2 97%   Physical Exam Constitutional:  Appearance: She is well-developed.  HENT:     Head: Normocephalic and atraumatic.  Eyes:     Pupils: Pupils are equal, round, and reactive to light.  Cardiovascular:     Rate and Rhythm: Normal rate and regular rhythm.     Heart sounds: Normal heart sounds.  Pulmonary:     Effort: Pulmonary effort is normal. No respiratory distress.     Breath sounds: Normal breath sounds. No wheezing or rales.  Chest:     Chest wall: No tenderness.  Abdominal:     General: Bowel sounds are normal.     Palpations: Abdomen is soft.     Tenderness: There is no abdominal  tenderness. There is no guarding or rebound.  Musculoskeletal:        General: Normal range of motion.     Cervical back: Normal range of motion and neck supple.     Comments: Mild tenderness to the lower lumbar spine.  No step-offs or deformities.  Negative straight leg raise bilaterally.  Patellar reflexes symmetric bilaterally.  Pedal pulses are intact.  She has normal sensation and motor function to the lower extremities bilaterally.  Gait is normal.  She can walk on her toes and walk on her heels without difficulty.  Lymphadenopathy:     Cervical: No cervical adenopathy.  Skin:    General: Skin is warm and dry.     Findings: No rash.  Neurological:     Mental Status: She is alert and oriented to person, place, and time.     (all labs ordered are listed, but only abnormal results are displayed) Labs Reviewed  URINALYSIS, W/ REFLEX TO CULTURE (INFECTION SUSPECTED) - Abnormal; Notable for the following components:      Result Value   Hgb urine dipstick SMALL (*)    Leukocytes,Ua TRACE (*)    All other components within normal limits  BASIC METABOLIC PANEL WITH GFR  CBC WITH DIFFERENTIAL/PLATELET    EKG: None  Radiology: No results found.   Procedures   Medications Ordered in the ED  dexamethasone  (DECADRON ) injection 10 mg (has no administration in time range)                                    Medical Decision Making Amount and/or Complexity of Data Reviewed Labs: ordered.  Risk Prescription drug management.   This patient presents to the ED for concern of back pain, paresthesias, fatigue, this involves an extensive number of treatment options, and is a complaint that carries with it a high risk of complications and morbidity.  I considered the following differential and admission for this acute, potentially life threatening condition.  The differential diagnosis includes herniated disc, sciatica, neuropathy, epidural abscess, epidural hematoma,  Differential  diagnosis for the fatigue includes infection, anemia, electrolyte abnormality, viral syndrome  MDM:    Patient is a 43 year old who presents primarily with pins-and-needles feeling in her lower legs.  It is worse when she lays on her left side and feels similar to when she had back issues in the past.  She has had prior spinal injections.  She is followed by [facility].  She does not have much back pain currently but she has radicular symptoms down both of her legs.  She has intact sensation in the legs.  Normal motor function.  She does not have a fever or other symptoms that would be more concerning for  infection or spinal hematoma.  No trauma that would warrant emergent imaging.  Her labs were checked given her fatigue.  She has no anemia.  No electrolyte abnormality.  Her urine does not appear consistent with infection.  She was discharged home in good condition.  She was given a shot of Decadron  in the ED.  She will follow-up with her spine doctor.  Also encouraged her to follow-up with her PCP if her fatigue continues.  Return precautions were given.  (Labs, imaging, consults)  Labs: I Ordered, and personally interpreted labs.  The pertinent results include: No anemia, electrolytes okay  Imaging Studies ordered: I ordered imaging studies including none I independently visualized and interpreted imaging. I agree with the radiologist interpretation  Additional history obtained from chart.  External records from outside source obtained and reviewed including prior notes  Cardiac Monitoring: The patient was not maintained on a cardiac monitor.  If on the cardiac monitor, I personally viewed and interpreted the cardiac monitored which showed an underlying rhythm of:    Reevaluation: After the interventions noted above, I reevaluated the patient and found that they have :improved  Social Determinants of Health:    Disposition: Discharged to home  Co morbidities that  complicate the patient evaluation History reviewed. No pertinent past medical history.   Medicines Meds ordered this encounter  Medications   dexamethasone  (DECADRON ) injection 10 mg    I have reviewed the patients home medicines and have made adjustments as needed  Problem List / ED Course: Problem List Items Addressed This Visit   None Visit Diagnoses       Acute midline low back pain with bilateral sciatica    -  Primary   Relevant Medications   dexamethasone  (DECADRON ) injection 10 mg (Start on 11/01/2023 11:00 PM)     Fatigue, unspecified type                    Final diagnoses:  Acute midline low back pain with bilateral sciatica  Fatigue, unspecified type    ED Discharge Orders     None          Lenor Hollering, MD 11/01/23 2258

## 2023-11-01 NOTE — Discharge Instructions (Signed)
 Follow-up with your spine doctor regarding your back pain.  Follow-up with your primary care doctor if your fatigue continues.  Return to the emergency room if you have any worsening symptoms.

## 2023-11-01 NOTE — ED Triage Notes (Signed)
 Pt c/o intermittent tingling in BLE x3wks, weakness that comes & goes; around 12 I kind of hit a wall & I'm done. Tingling/ burning pain in legs Hx back issues, not sure if it's related. Advises sometimes I have to urinate more frequently, but denies bowel/ bladder incontinence.

## 2023-11-18 ENCOUNTER — Ambulatory Visit (INDEPENDENT_AMBULATORY_CARE_PROVIDER_SITE_OTHER): Payer: Self-pay | Admitting: Adult Health

## 2023-11-18 ENCOUNTER — Encounter: Payer: Self-pay | Admitting: Adult Health

## 2023-11-18 DIAGNOSIS — Z0389 Encounter for observation for other suspected diseases and conditions ruled out: Secondary | ICD-10-CM

## 2023-11-18 NOTE — Progress Notes (Signed)
 Patient no show appointment. ? ?

## 2023-12-02 ENCOUNTER — Telehealth: Admitting: Adult Health

## 2023-12-02 ENCOUNTER — Encounter: Payer: Self-pay | Admitting: Adult Health

## 2023-12-02 DIAGNOSIS — F909 Attention-deficit hyperactivity disorder, unspecified type: Secondary | ICD-10-CM

## 2023-12-02 DIAGNOSIS — G4700 Insomnia, unspecified: Secondary | ICD-10-CM | POA: Diagnosis not present

## 2023-12-02 DIAGNOSIS — F902 Attention-deficit hyperactivity disorder, combined type: Secondary | ICD-10-CM

## 2023-12-02 MED ORDER — AMPHETAMINE-DEXTROAMPHETAMINE 20 MG PO TABS: 20.0000 mg | ORAL_TABLET | Freq: Two times a day (BID) | ORAL | 0 refills | Status: DC

## 2023-12-02 NOTE — Progress Notes (Signed)
 Amanda Baird [id number] [birth year] 43 y.o.  Virtual Visit via Video Note  I connected with pt @ on 12/02/23 at 11:30 AM EDT by a video enabled telemedicine application and verified that I am speaking with the correct person using two identifiers.   I discussed the limitations of evaluation and management by telemedicine and the availability of in person appointments. The patient expressed understanding and agreed to proceed.  I discussed the assessment and treatment plan with the patient. The patient was provided an opportunity to ask questions and all were answered. The patient agreed with the plan and demonstrated an understanding of the instructions.   The patient was advised to call back or seek an in-person evaluation if the symptoms worsen or if the condition fails to improve as anticipated.  I provided 10 minutes of non-face-to-face time during this encounter.  The patient was located at home.  The provider was located at [facility].   Angeline LOISE Sayers, NP   Subjective:   Patient ID:  Amanda Baird is a 43 y.o. (DOB Oct 26, 1980) female.  Chief Complaint: No chief complaint on file.   HPI Amanda Baird presents for follow-up of ADD and insomnia.  Describes mood today as ok. Pleasant. Denies tearfulness. Mood symptoms - denies depression, anxiety and irritability. Reports stable interest and motivation. Denies panic attacks. Reports some worry, rumination and over thinking. Reports mood is stable. Feels like current medication regimen works well. Taking medications as prescribed.  Energy levels stable. Active, does not have a regular exercise routine. Enjoys some usual interests and activities. Married. Has 5 children between. Spending time with family. Appetite adequate. Weight gain - 136 to 142 pounds. Reports sleep has improved. Averages 7 hours.  Focus and concentration improved. Feels like the Adderall is helpful. Completing tasks. Managing aspects of  household. 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 Denies SI or HI.  Denies AH or VH. Denies self harm. Denies substance use. Denies alcohol use.   Previous medication trials:  Zoloft, Cymbalta   Review of Systems:  Review of Systems  Musculoskeletal:  Negative for gait problem.  Neurological:  Negative for tremors.  Psychiatric/Behavioral:         Please refer to HPI    Medications: I have reviewed the patient's current medications.  Current Outpatient Medications  Medication Sig Dispense Refill   amphetamine -dextroamphetamine  (ADDERALL) 20 MG tablet Take 1 tablet (20 mg total) by mouth 2 (two) times daily. 60 tablet 0   amphetamine -dextroamphetamine  (ADDERALL) 20 MG tablet Take 1 tablet (20 mg total) by mouth 2 (two) times daily. 60 tablet 0   amphetamine -dextroamphetamine  (ADDERALL) 20 MG tablet Take 1 tablet (20 mg total) by mouth 2 (two) times daily. 60 tablet 0   benzonatate  (TESSALON ) 100 MG capsule Take 1 capsule (100 mg total) by mouth every 8 (eight) hours. 21 capsule 0   hydrOXYzine  (ATARAX ) 25 MG tablet Take one to two tablets at bedtime as needed for sleep. 60 tablet 5   ondansetron  (ZOFRAN -ODT) 4 MG disintegrating tablet 4mg  ODT q4 hours prn nausea/vomit 20 tablet 0   No current facility-administered medications for this visit.    Medication Side Effects: None  Allergies:  Allergies  Allergen Reactions   Duloxetine Other (See Comments)    No appetite.    No past medical history on file.  No family history on file.  Social History   Socioeconomic History   Marital status:  Married    Spouse name: Not on file   Number of children: Not on file   Years of education: Not on file   Highest education level: Not on file  Occupational History   Not on file  Tobacco Use   Smoking status: Never   Smokeless tobacco: Never  Vaping Use   Vaping status: Never Used   Substance and Sexual Activity   Alcohol use: Not Currently    Comment: occ.   Drug use: No   Sexual activity: Not on file  Other Topics Concern   Not on file  Social History Narrative   Not on file   Social Drivers of Health   Financial Resource Strain: Not on file  Food Insecurity: Not on file  Transportation Needs: Not on file  Physical Activity: Not on file  Stress: Not on file  Social Connections: Not on file  Intimate Partner Violence: Not on file    Past Medical History, Surgical history, Social history, and Family history were reviewed and updated as appropriate.   Please see review of systems for further details on the patient's review from today.   Objective:   Physical Exam:  LMP 05/06/2015 (Exact Date)   Physical Exam Constitutional:      General: She is not in acute distress. Musculoskeletal:        General: No deformity.  Neurological:     Mental Status: She is alert and oriented to person, place, and time.     Coordination: Coordination normal.  Psychiatric:        Attention and Perception: Attention and perception normal. She does not perceive auditory or visual hallucinations.        Mood and Affect: Mood normal. Mood is not anxious or depressed. Affect is not labile, blunt, angry or inappropriate.        Speech: Speech normal.        Behavior: Behavior normal.        Thought Content: Thought content normal. Thought content is not paranoid or delusional. Thought content does not include homicidal or suicidal ideation. Thought content does not include homicidal or suicidal plan.        Cognition and Memory: Cognition and memory normal.        Judgment: Judgment normal.     Comments: Insight intact     Lab Review:     Component Value Date/Time   NA 141 11/01/2023 2203   K 3.8 11/01/2023 2203   CL 104 11/01/2023 2203   CO2 27 11/01/2023 2203   GLUCOSE 91 11/01/2023 2203   BUN 12 11/01/2023 2203   CREATININE 0.97 11/01/2023 2203   CALCIUM 9.5  11/01/2023 2203   PROT 7.3 06/11/2023 0028   ALBUMIN 4.4 06/11/2023 0028   AST 14 (L) 06/11/2023 0028   ALT 9 06/11/2023 0028   ALKPHOS 53 06/11/2023 0028   BILITOT 0.5 06/11/2023 0028   GFRNONAA >60 11/01/2023 2203   GFRAA >60 10/25/2016 2300       Component Value Date/Time   WBC 4.3 11/01/2023 2203   RBC 4.58 11/01/2023 2203   HGB 14.0 11/01/2023 2203   HCT 43.4 11/01/2023 2203   PLT 202 11/01/2023 2203   MCV 94.8 11/01/2023 2203   MCH 30.6 11/01/2023 2203   MCHC 32.3 11/01/2023 2203   RDW 11.9 11/01/2023 2203   LYMPHSABS 1.5 11/01/2023 2203   MONOABS 0.5 11/01/2023 2203   EOSABS 0.1 11/01/2023 2203   BASOSABS 0.0 11/01/2023 2203    No  results found for: POCLITH, LITHIUM   No results found for: PHENYTOIN, PHENOBARB, VALPROATE, CBMZ   .res Assessment: Plan:    Plan:  PDMP reviewed  Adderall 20mg  twice daily  Hydroxyzine  25mg  BID  Discussed NAC tabs - Magnesium  L-Threonate  [facility] 45/58 ADD likely  RTC 3 months  15 minutes spent dedicated to the care of this patient on the date of this encounter to include pre-visit review of records, ordering of medication, post visit documentation, and face-to-face time with the patient discussing ADD and insomnia. Discussed continuing current medication regimen.  Patient advised to contact office with any questions, adverse effects, or acute worsening in signs and symptoms.  Discussed potential benefits, risks, and side effects of stimulants with patient to include increased heart rate, palpitations, insomnia, increased anxiety, increased irritability, or decreased appetite.  Instructed patient to contact office if experiencing any significant tolerability issues.  There are no diagnoses linked to this encounter.   Please see After Visit Summary for patient specific instructions.  Future Appointments  Date Time Provider Department Center  12/02/2023 11:30 AM Nakota Elsen Nattalie, NP [facility] None     No orders of the defined types were placed in this encounter.     -------------------------------

## 2024-03-02 ENCOUNTER — Encounter: Payer: Self-pay | Admitting: Adult Health

## 2024-03-02 ENCOUNTER — Telehealth: Admitting: Adult Health

## 2024-03-02 DIAGNOSIS — F909 Attention-deficit hyperactivity disorder, unspecified type: Secondary | ICD-10-CM | POA: Diagnosis not present

## 2024-03-02 DIAGNOSIS — G4700 Insomnia, unspecified: Secondary | ICD-10-CM

## 2024-03-02 DIAGNOSIS — F902 Attention-deficit hyperactivity disorder, combined type: Secondary | ICD-10-CM

## 2024-03-02 MED ORDER — AMPHETAMINE-DEXTROAMPHETAMINE 20 MG PO TABS: 20.0000 mg | ORAL_TABLET | Freq: Two times a day (BID) | ORAL | 0 refills | Status: AC

## 2024-03-02 NOTE — Progress Notes (Signed)
 Amanda Baird [id number] 03-22-1980 43 y.o.  Virtual Visit via Video Note  I connected with pt @ on 03/02/2024 at  9:30 AM EST by a video enabled telemedicine application and verified that I am speaking with the correct person using two identifiers.   I discussed the limitations of evaluation and management by telemedicine and the availability of in person appointments. The patient expressed understanding and agreed to proceed.  I discussed the assessment and treatment plan with the patient. The patient was provided an opportunity to ask questions and all were answered. The patient agreed with the plan and demonstrated an understanding of the instructions.   The patient was advised to call back or seek an in-person evaluation if the symptoms worsen or if the condition fails to improve as anticipated.  I provided 10 minutes of non-face-to-face time during this encounter.  The patient was located at home.  The provider was located at [facility].   Angeline LOISE Sayers, NP   Subjective:   Patient ID:  Amanda Baird is a 43 y.o. (DOB [birth year]) female.  Chief Complaint: No chief complaint on file.   HPI [name] presents for follow-up of ADD and insomnia.  Describes mood today as ok. Pleasant. Denies tearfulness. Mood symptoms - denies depression, anxiety and irritability. Reports stable interest and motivation. Denies panic attacks. Reports some worry, rumination and over thinking. Reports mood is stable. Stating I feel like I'm doing ok. Feels like current medication regimen works well. Taking medications as prescribed.  Energy levels stable. Active, does not have a consistent exercise routine. Enjoys some usual interests and activities. Married. Has 5 children between. Spending time with family. Appetite adequate. Weight gain - 138 pounds. Reports sleep has improved. Averages 7 hours.  Focus and concentration improved. Feels like the Adderall is helpful.  Completing tasks. Managing aspects of household. Works full time - group homes. Works for the [employer]. 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 Denies SI or HI.  Denies AH or VH. Denies self harm. Denies substance use. Denies alcohol use.   Previous medication trials:  Zoloft, Cymbalta    Review of Systems:  Review of Systems  Musculoskeletal:  Negative for gait problem.  Neurological:  Negative for tremors.  Psychiatric/Behavioral:         Please refer to HPI    Medications: I have reviewed the patient's current medications.  Current Outpatient Medications  Medication Sig Dispense Refill   amphetamine -dextroamphetamine  (ADDERALL) 20 MG tablet Take 1 tablet (20 mg total) by mouth 2 (two) times daily. 60 tablet 0   amphetamine -dextroamphetamine  (ADDERALL) 20 MG tablet Take 1 tablet (20 mg total) by mouth 2 (two) times daily. 60 tablet 0   amphetamine -dextroamphetamine  (ADDERALL) 20 MG tablet Take 1 tablet (20 mg total) by mouth 2 (two) times daily. 60 tablet 0   benzonatate  (TESSALON ) 100 MG capsule Take 1 capsule (100 mg total) by mouth every 8 (eight) hours. 21 capsule 0   hydrOXYzine  (ATARAX ) 25 MG tablet Take one to two tablets at bedtime as needed for sleep. 60 tablet 5   ondansetron  (ZOFRAN -ODT) 4 MG disintegrating tablet 4mg  ODT q4 hours prn nausea/vomit 20 tablet 0   No current facility-administered medications for this visit.    Medication Side Effects: None  Allergies: Allergies[1]  No past medical history on file.  No family history on file.  Social History   Socioeconomic History   Marital status: Married    Spouse name: Not on file   Number  of children: Not on file   Years of education: Not on file   Highest education level: Not on file  Occupational History   Not on file  Tobacco Use   Smoking status: Never   Smokeless tobacco: Never  Vaping Use   Vaping status: Never Used  Substance and Sexual Activity   Alcohol use: Not  Currently    Comment: occ.   Drug use: No   Sexual activity: Not on file  Other Topics Concern   Not on file  Social History Narrative   Not on file   Social Drivers of Health   Tobacco Use: Low Risk (12/02/2023)   Patient History    Smoking Tobacco Use: Never    Smokeless Tobacco Use: Never    Passive Exposure: Not on file  Financial Resource Strain: Not on file  Food Insecurity: Not on file  Transportation Needs: Not on file  Physical Activity: Not on file  Stress: Not on file  Social Connections: Not on file  Intimate Partner Violence: Not on file  Depression (EYV7-0): Not on file  Alcohol Screen: Not on file  Housing: Not on file  Utilities: Not on file  Health Literacy: Not on file    Past Medical History, Surgical history, Social history, and Family history were reviewed and updated as appropriate.   Please see review of systems for further details on the patient's review from today.   Objective:   Physical Exam:  LMP 05/06/2015   Physical Exam Constitutional:      General: She is not in acute distress. Musculoskeletal:        General: No deformity.  Neurological:     Mental Status: She is alert and oriented to person, place, and time.     Coordination: Coordination normal.  Psychiatric:        Attention and Perception: Attention and perception normal. She does not perceive auditory or visual hallucinations.        Mood and Affect: Mood normal. Mood is not anxious or depressed. Affect is not labile, blunt, angry or inappropriate.        Speech: Speech normal.        Behavior: Behavior normal.        Thought Content: Thought content normal. Thought content is not paranoid or delusional. Thought content does not include homicidal or suicidal ideation. Thought content does not include homicidal or suicidal plan.        Cognition and Memory: Cognition and memory normal.        Judgment: Judgment normal.     Comments: Insight intact     Lab Review:      Component Value Date/Time   NA 141 11/01/2023 2203   K 3.8 11/01/2023 2203   CL 104 11/01/2023 2203   CO2 27 11/01/2023 2203   GLUCOSE 91 11/01/2023 2203   BUN 12 11/01/2023 2203   CREATININE 0.97 11/01/2023 2203   CALCIUM 9.5 11/01/2023 2203   PROT 7.3 06/11/2023 0028   ALBUMIN 4.4 06/11/2023 0028   AST 14 (L) 06/11/2023 0028   ALT 9 06/11/2023 0028   ALKPHOS 53 06/11/2023 0028   BILITOT 0.5 06/11/2023 0028   GFRNONAA >60 11/01/2023 2203   GFRAA >60 10/25/2016 2300       Component Value Date/Time   WBC 4.3 11/01/2023 2203   RBC 4.58 11/01/2023 2203   HGB 14.0 11/01/2023 2203   HCT 43.4 11/01/2023 2203   PLT 202 11/01/2023 2203   MCV 94.8 11/01/2023 2203  MCH 30.6 11/01/2023 2203   MCHC 32.3 11/01/2023 2203   RDW 11.9 11/01/2023 2203   LYMPHSABS 1.5 11/01/2023 2203   MONOABS 0.5 11/01/2023 2203   EOSABS 0.1 11/01/2023 2203   BASOSABS 0.0 11/01/2023 2203    No results found for: POCLITH, LITHIUM   No results found for: PHENYTOIN, PHENOBARB, VALPROATE, CBMZ   .res Assessment: Plan:    Plan:  PDMP reviewed  Adderall 20mg  twice daily  Hydroxyzine  25mg  BID  Discussed NAC tabs - Magnesium  L-Threonate  [facility] 45/58 ADD likely  RTC 3 months  10 minutes spent dedicated to the care of this patient on the date of this encounter to include pre-visit review of records, ordering of medication, post visit documentation, and face-to-face time with the patient discussing ADD and insomnia. Discussed continuing current medication regimen.  Patient advised to contact office with any questions, adverse effects, or acute worsening in signs and symptoms.  Discussed potential benefits, risks, and side effects of stimulants with patient to include increased heart rate, palpitations, insomnia, increased anxiety, increased irritability, or decreased appetite.  Instructed patient to contact office if experiencing any significant tolerability issues.   There are  no diagnoses linked to this encounter.   Please see After Visit Summary for patient specific instructions.  Future Appointments  Date Time Provider Department Center  03/02/2024  9:30 AM Takeia Ciaravino Nattalie, NP [facility] None    No orders of the defined types were placed in this encounter.     -------------------------------     [1]  Allergies Allergen Reactions   Duloxetine Other (See Comments)    No appetite.
